# Patient Record
Sex: Female | Born: 1955 | Race: Black or African American | Hispanic: No | Marital: Married | Smoking: Current every day smoker
Health system: Southern US, Community
[De-identification: ages and names within clinical notes are randomized; demographics above are authoritative.]

## PROBLEM LIST (undated history)

## (undated) DIAGNOSIS — F410 Panic disorder [episodic paroxysmal anxiety] without agoraphobia: Secondary | ICD-10-CM

## (undated) DIAGNOSIS — E8881 Metabolic syndrome: Secondary | ICD-10-CM

## (undated) DIAGNOSIS — E119 Type 2 diabetes mellitus without complications: Secondary | ICD-10-CM

## (undated) DIAGNOSIS — E669 Obesity, unspecified: Secondary | ICD-10-CM

## (undated) DIAGNOSIS — I10 Essential (primary) hypertension: Secondary | ICD-10-CM

## (undated) DIAGNOSIS — K279 Peptic ulcer, site unspecified, unspecified as acute or chronic, without hemorrhage or perforation: Secondary | ICD-10-CM

## (undated) DIAGNOSIS — R7989 Other specified abnormal findings of blood chemistry: Secondary | ICD-10-CM

## (undated) DIAGNOSIS — J302 Other seasonal allergic rhinitis: Secondary | ICD-10-CM

## (undated) DIAGNOSIS — R945 Abnormal results of liver function studies: Secondary | ICD-10-CM

## (undated) DIAGNOSIS — E88819 Insulin resistance, unspecified: Secondary | ICD-10-CM

## (undated) DIAGNOSIS — E785 Hyperlipidemia, unspecified: Secondary | ICD-10-CM

## (undated) DIAGNOSIS — N809 Endometriosis, unspecified: Secondary | ICD-10-CM

## (undated) HISTORY — PX: ABDOMINAL HYSTERECTOMY: SHX81

## (undated) HISTORY — PX: APPENDECTOMY: SHX54

## (undated) HISTORY — DX: Type 2 diabetes mellitus without complications: E11.9

---

## 1998-02-19 ENCOUNTER — Ambulatory Visit (HOSPITAL_COMMUNITY): Admission: RE | Admit: 1998-02-19 | Discharge: 1998-02-19 | Payer: Self-pay | Admitting: Podiatry

## 1998-09-10 ENCOUNTER — Inpatient Hospital Stay (HOSPITAL_COMMUNITY): Admission: RE | Admit: 1998-09-10 | Discharge: 1998-09-12 | Payer: Self-pay | Admitting: Gynecology

## 1999-07-21 ENCOUNTER — Encounter: Payer: Self-pay | Admitting: Gynecology

## 1999-07-21 ENCOUNTER — Encounter: Admission: RE | Admit: 1999-07-21 | Discharge: 1999-07-21 | Payer: Self-pay | Admitting: Gynecology

## 1999-09-29 ENCOUNTER — Other Ambulatory Visit: Admission: RE | Admit: 1999-09-29 | Discharge: 1999-09-29 | Payer: Self-pay | Admitting: Gynecology

## 2000-10-05 ENCOUNTER — Encounter: Admission: RE | Admit: 2000-10-05 | Discharge: 2000-10-05 | Payer: Self-pay | Admitting: Gynecology

## 2000-10-05 ENCOUNTER — Encounter: Payer: Self-pay | Admitting: Gynecology

## 2000-10-12 ENCOUNTER — Other Ambulatory Visit: Admission: RE | Admit: 2000-10-12 | Discharge: 2000-10-12 | Payer: Self-pay | Admitting: Gynecology

## 2001-10-06 ENCOUNTER — Encounter: Admission: RE | Admit: 2001-10-06 | Discharge: 2001-10-06 | Payer: Self-pay | Admitting: Gynecology

## 2001-10-06 ENCOUNTER — Encounter: Payer: Self-pay | Admitting: Gynecology

## 2001-10-20 ENCOUNTER — Other Ambulatory Visit: Admission: RE | Admit: 2001-10-20 | Discharge: 2001-10-20 | Payer: Self-pay | Admitting: Gynecology

## 2002-01-01 ENCOUNTER — Other Ambulatory Visit: Admission: RE | Admit: 2002-01-01 | Discharge: 2002-01-01 | Payer: Self-pay | Admitting: Gynecology

## 2002-07-31 ENCOUNTER — Other Ambulatory Visit: Admission: RE | Admit: 2002-07-31 | Discharge: 2002-07-31 | Payer: Self-pay | Admitting: Gynecology

## 2002-10-24 ENCOUNTER — Encounter: Payer: Self-pay | Admitting: Family Medicine

## 2002-10-24 ENCOUNTER — Encounter: Admission: RE | Admit: 2002-10-24 | Discharge: 2002-10-24 | Payer: Self-pay | Admitting: *Deleted

## 2002-10-29 ENCOUNTER — Encounter: Payer: Self-pay | Admitting: Family Medicine

## 2002-10-29 ENCOUNTER — Encounter: Admission: RE | Admit: 2002-10-29 | Discharge: 2002-10-29 | Payer: Self-pay | Admitting: Family Medicine

## 2003-06-27 ENCOUNTER — Other Ambulatory Visit: Admission: RE | Admit: 2003-06-27 | Discharge: 2003-06-27 | Payer: Self-pay | Admitting: Gynecology

## 2003-07-02 ENCOUNTER — Encounter: Admission: RE | Admit: 2003-07-02 | Discharge: 2003-07-02 | Payer: Self-pay | Admitting: Gynecology

## 2003-09-23 ENCOUNTER — Encounter: Admission: RE | Admit: 2003-09-23 | Discharge: 2003-12-22 | Payer: Self-pay | Admitting: Family Medicine

## 2004-07-23 ENCOUNTER — Other Ambulatory Visit: Admission: RE | Admit: 2004-07-23 | Discharge: 2004-07-23 | Payer: Self-pay | Admitting: Gynecology

## 2004-08-26 ENCOUNTER — Encounter: Admission: RE | Admit: 2004-08-26 | Discharge: 2004-08-26 | Payer: Self-pay | Admitting: Gynecology

## 2005-08-30 ENCOUNTER — Encounter: Admission: RE | Admit: 2005-08-30 | Discharge: 2005-08-30 | Payer: Self-pay | Admitting: Gynecology

## 2005-09-06 ENCOUNTER — Other Ambulatory Visit: Admission: RE | Admit: 2005-09-06 | Discharge: 2005-09-06 | Payer: Self-pay | Admitting: Gynecology

## 2006-09-14 ENCOUNTER — Encounter: Admission: RE | Admit: 2006-09-14 | Discharge: 2006-09-14 | Payer: Self-pay | Admitting: Gynecology

## 2006-09-27 ENCOUNTER — Other Ambulatory Visit: Admission: RE | Admit: 2006-09-27 | Discharge: 2006-09-27 | Payer: Self-pay | Admitting: Gynecology

## 2007-05-02 ENCOUNTER — Ambulatory Visit: Payer: Self-pay

## 2007-09-26 ENCOUNTER — Encounter: Admission: RE | Admit: 2007-09-26 | Discharge: 2007-09-26 | Payer: Self-pay | Admitting: Gynecology

## 2008-09-26 ENCOUNTER — Encounter: Admission: RE | Admit: 2008-09-26 | Discharge: 2008-09-26 | Payer: Self-pay | Admitting: Gynecology

## 2009-09-30 ENCOUNTER — Encounter: Admission: RE | Admit: 2009-09-30 | Discharge: 2009-09-30 | Payer: Self-pay | Admitting: Gynecology

## 2010-10-07 ENCOUNTER — Other Ambulatory Visit: Payer: Self-pay | Admitting: Gynecology

## 2010-10-07 DIAGNOSIS — Z1231 Encounter for screening mammogram for malignant neoplasm of breast: Secondary | ICD-10-CM

## 2010-10-21 ENCOUNTER — Ambulatory Visit
Admission: RE | Admit: 2010-10-21 | Discharge: 2010-10-21 | Disposition: A | Discharge status: Home / Self Care | Payer: BC Managed Care – PPO | Source: Ambulatory Visit | Attending: Gynecology | Admitting: Gynecology

## 2010-10-21 DIAGNOSIS — Z1231 Encounter for screening mammogram for malignant neoplasm of breast: Secondary | ICD-10-CM

## 2011-11-04 ENCOUNTER — Other Ambulatory Visit: Payer: Self-pay | Admitting: Gynecology

## 2011-11-04 DIAGNOSIS — Z1231 Encounter for screening mammogram for malignant neoplasm of breast: Secondary | ICD-10-CM

## 2011-11-18 ENCOUNTER — Ambulatory Visit
Admission: RE | Admit: 2011-11-18 | Discharge: 2011-11-18 | Disposition: A | Discharge status: Home / Self Care | Payer: BC Managed Care – PPO | Source: Ambulatory Visit | Attending: Gynecology | Admitting: Gynecology

## 2011-11-18 DIAGNOSIS — Z1231 Encounter for screening mammogram for malignant neoplasm of breast: Secondary | ICD-10-CM

## 2012-09-13 ENCOUNTER — Emergency Department (HOSPITAL_COMMUNITY): Payer: BC Managed Care – PPO

## 2012-09-13 ENCOUNTER — Encounter (HOSPITAL_COMMUNITY): Payer: Self-pay

## 2012-09-13 ENCOUNTER — Inpatient Hospital Stay (HOSPITAL_COMMUNITY)
Admission: EM | Admit: 2012-09-13 | Discharge: 2012-09-15 | DRG: 174 | Disposition: A | Discharge status: Home / Self Care | Payer: BC Managed Care – PPO | Attending: Internal Medicine | Admitting: Internal Medicine

## 2012-09-13 DIAGNOSIS — K922 Gastrointestinal hemorrhage, unspecified: Secondary | ICD-10-CM

## 2012-09-13 DIAGNOSIS — R945 Abnormal results of liver function studies: Secondary | ICD-10-CM | POA: Diagnosis present

## 2012-09-13 DIAGNOSIS — R7402 Elevation of levels of lactic acid dehydrogenase (LDH): Secondary | ICD-10-CM | POA: Diagnosis present

## 2012-09-13 DIAGNOSIS — J309 Allergic rhinitis, unspecified: Secondary | ICD-10-CM | POA: Diagnosis present

## 2012-09-13 DIAGNOSIS — E785 Hyperlipidemia, unspecified: Secondary | ICD-10-CM | POA: Diagnosis present

## 2012-09-13 DIAGNOSIS — Z79899 Other long term (current) drug therapy: Secondary | ICD-10-CM

## 2012-09-13 DIAGNOSIS — K921 Melena: Secondary | ICD-10-CM | POA: Diagnosis present

## 2012-09-13 DIAGNOSIS — E8881 Metabolic syndrome: Secondary | ICD-10-CM | POA: Diagnosis present

## 2012-09-13 DIAGNOSIS — Z6828 Body mass index (BMI) 28.0-28.9, adult: Secondary | ICD-10-CM

## 2012-09-13 DIAGNOSIS — K3182 Dieulafoy lesion (hemorrhagic) of stomach and duodenum: Principal | ICD-10-CM | POA: Diagnosis present

## 2012-09-13 DIAGNOSIS — F17200 Nicotine dependence, unspecified, uncomplicated: Secondary | ICD-10-CM | POA: Diagnosis present

## 2012-09-13 DIAGNOSIS — Z886 Allergy status to analgesic agent status: Secondary | ICD-10-CM

## 2012-09-13 DIAGNOSIS — Z8711 Personal history of peptic ulcer disease: Secondary | ICD-10-CM

## 2012-09-13 DIAGNOSIS — R7401 Elevation of levels of liver transaminase levels: Secondary | ICD-10-CM

## 2012-09-13 DIAGNOSIS — Z90710 Acquired absence of both cervix and uterus: Secondary | ICD-10-CM

## 2012-09-13 DIAGNOSIS — Z888 Allergy status to other drugs, medicaments and biological substances status: Secondary | ICD-10-CM

## 2012-09-13 DIAGNOSIS — Z9089 Acquired absence of other organs: Secondary | ICD-10-CM

## 2012-09-13 DIAGNOSIS — K2970 Gastritis, unspecified, without bleeding: Secondary | ICD-10-CM | POA: Diagnosis present

## 2012-09-13 DIAGNOSIS — F410 Panic disorder [episodic paroxysmal anxiety] without agoraphobia: Secondary | ICD-10-CM | POA: Diagnosis present

## 2012-09-13 DIAGNOSIS — D62 Acute posthemorrhagic anemia: Secondary | ICD-10-CM

## 2012-09-13 DIAGNOSIS — E669 Obesity, unspecified: Secondary | ICD-10-CM | POA: Diagnosis present

## 2012-09-13 DIAGNOSIS — I10 Essential (primary) hypertension: Secondary | ICD-10-CM | POA: Diagnosis present

## 2012-09-13 HISTORY — DX: Obesity, unspecified: E66.9

## 2012-09-13 HISTORY — DX: Endometriosis, unspecified: N80.9

## 2012-09-13 HISTORY — DX: Other seasonal allergic rhinitis: J30.2

## 2012-09-13 HISTORY — DX: Hyperlipidemia, unspecified: E78.5

## 2012-09-13 HISTORY — DX: Insulin resistance, unspecified: E88.819

## 2012-09-13 HISTORY — DX: Panic disorder (episodic paroxysmal anxiety): F41.0

## 2012-09-13 HISTORY — DX: Peptic ulcer, site unspecified, unspecified as acute or chronic, without hemorrhage or perforation: K27.9

## 2012-09-13 HISTORY — DX: Other specified abnormal findings of blood chemistry: R79.89

## 2012-09-13 HISTORY — DX: Essential (primary) hypertension: I10

## 2012-09-13 HISTORY — DX: Metabolic syndrome: E88.81

## 2012-09-13 HISTORY — DX: Abnormal results of liver function studies: R94.5

## 2012-09-13 LAB — CBC
Hemoglobin: 9.3 g/dL — ABNORMAL LOW (ref 12.0–15.0)
MCHC: 32.9 g/dL (ref 30.0–36.0)
RDW: 14.7 % (ref 11.5–15.5)
WBC: 14.5 10*3/uL — ABNORMAL HIGH (ref 4.0–10.5)

## 2012-09-13 LAB — COMPREHENSIVE METABOLIC PANEL
ALT: 49 U/L — ABNORMAL HIGH (ref 0–35)
Albumin: 3.7 g/dL (ref 3.5–5.2)
Alkaline Phosphatase: 256 U/L — ABNORMAL HIGH (ref 39–117)
Glucose, Bld: 102 mg/dL — ABNORMAL HIGH (ref 70–99)
Potassium: 4 mEq/L (ref 3.5–5.1)
Sodium: 137 mEq/L (ref 135–145)
Total Protein: 8.4 g/dL — ABNORMAL HIGH (ref 6.0–8.3)

## 2012-09-13 LAB — ABO/RH: ABO/RH(D): B POS

## 2012-09-13 MED ORDER — ONDANSETRON HCL 4 MG/2ML IJ SOLN
4.0000 mg | Freq: Once | INTRAMUSCULAR | Status: AC
  Administered 2012-09-13: 4 mg via INTRAVENOUS
  Filled 2012-09-13: qty 2

## 2012-09-13 MED ORDER — SODIUM CHLORIDE 0.9 % IV SOLN
INTRAVENOUS | Status: DC
  Administered 2012-09-13: 18:00:00 via INTRAVENOUS

## 2012-09-13 MED ORDER — SODIUM CHLORIDE 0.9 % IV SOLN
INTRAVENOUS | Status: DC
  Administered 2012-09-13: via INTRAVENOUS

## 2012-09-13 MED ORDER — IOHEXOL 300 MG/ML  SOLN
50.0000 mL | Freq: Once | INTRAMUSCULAR | Status: AC | PRN
  Administered 2012-09-13: 50 mL via ORAL

## 2012-09-13 MED ORDER — IOHEXOL 300 MG/ML  SOLN
100.0000 mL | Freq: Once | INTRAMUSCULAR | Status: AC | PRN
  Administered 2012-09-13: 100 mL via INTRAVENOUS

## 2012-09-13 NOTE — Progress Notes (Signed)
Pt has arrived from the ED via stretcher. Pt is in no apparent distress. Pt's vital signs are stable. Pt was oriented to room and safety video has been viewed. RN paged admissions MD and is awaiting further orders. RN will continue to monitor pt.-Judy Pimple, RN

## 2012-09-13 NOTE — ED Notes (Signed)
Pt ambulated to bathroom with no assistance.  

## 2012-09-13 NOTE — ED Notes (Signed)
Patient transported to CT 

## 2012-09-13 NOTE — ED Notes (Signed)
Admitting MD at bedside to speak with pt/family.

## 2012-09-13 NOTE — H&P (Signed)
History and Physical  Rachel Banks:[id number] DOB: 30-Mar-1956 DOA: 09/13/2012  Referring physician: Dr. Deretha Emory PCP: Default, Provider, MD Deboraha Sprang at [facility]   Chief Complaint: tarry stools  HPI:  57 year old woman presented emergency department with history of tarry stools, hemoglobin noted to be 9.3 and referred for admission for GI bleed.  She was doing fine until suddenly yesterday morning she noticed she had a low blood pressure. She had presyncopal symptoms with ambulation and reported to dark stools yesterday. She 1 dark stool day of admission. She continue to have presyncopal symptoms, dyspnea on exertion and felt like she would pass out. She denies history of any GI bleed. Denies NSAIDs. Has had a colonoscopy sometime in the recent past although she cannot remember who performed at, she reports was unremarkable. She thinks she is due for colonoscopy this year.  Seen by primary care physician and was sent to emergency department for hypotension, dizziness and tarry stools. Rectal exam heme positive per PCP.  In the emergency department--afebrile, stable vital signs. Noted to ambulate without assistance. Hemoglobin 9.3, white blood cell count 14.5. Complete metabolic panel notable for calcium 11.5, alkaline phosphatase 256, AST 45, ALT 49. CT of the abdomen and pelvis reveal no definite abnormality. Question mucosal thickening posterior rectum. abnormal lung bases with bronchial thickening and scattered pulmonary densities. Possibly chronic bronchitis or other chronic lung disease.  Review of Systems:  Negative for fever, visual changes, sore throat, rash, new muscle aches, chest pain, dysuria, n/v/abdominal pain/diarrhea  Positive for chronic right shoulder pain, DOE, poor appetite, flu 07/2012  Past Medical History  Diagnosis Date  . Panic attacks   . Hypertension   . Hyperlipidemia   . Obesity   . Seasonal allergies   . Insulin resistance   . Elevated LFTs      Past Surgical History  Procedure Date  . Abdominal hysterectomy     Social History:  reports that she has been smoking Cigarettes.  She has been smoking about .5 packs per day. She does not have any smokeless tobacco history on file. She reports that she does not drink alcohol or use illicit drugs.  Allergies  Allergen Reactions  . Patanol (Olopatadine Hcl) Swelling    No family history on file. No particular medical problems  Prior to Admission medications   Medication Sig Start Date End Date Taking? Authorizing Provider  amLODipine (NORVASC) 5 MG tablet Take 5 mg by mouth daily.   Yes Historical Provider, MD  cetirizine (ZYRTEC) 10 MG tablet Take 10 mg by mouth daily.   Yes Historical Provider, MD  hydrochlorothiazide (HYDRODIURIL) 25 MG tablet Take 25 mg by mouth daily.   Yes Historical Provider, MD  Multiple Vitamins-Minerals (MULTIVITAMIN PO) Take 1 tablet by mouth daily.   Yes Historical Provider, MD  Crestor 10  Physical Exam: Filed Vitals:   09/13/12 1800 09/13/12 1830 09/13/12 1915 09/13/12 2015  BP: 138/80 133/73 120/66 145/81  Pulse: 97 107 97   Temp:      TempSrc:      Resp: 18 14 18 14   SpO2: 100% 100% 100% 100%   General:  Examined in emergency department. Appears calm and comfortable Eyes: PERRL, normal lids, irises ENT: grossly normal hearing, lips & tongue Neck: no LAD, masses or thyromegaly Cardiovascular: RRR, no m/r/g. No LE edema.  Respiratory: CTA bilaterally, no w/r/r. Normal respiratory effort. Abdomen: soft, ntnd Skin: no rash or induration seen Musculoskeletal: grossly normal tone BUE/BLE Psychiatric: grossly normal mood and affect, speech fluent  and appropriate Neurologic: grossly non-focal.  Wt Readings from Last 3 Encounters:  No data found for Wt    Labs on Admission:  Basic Metabolic Panel:  Lab 09/13/12 1610  NA 137  K 4.0  CL 96  CO2 28  GLUCOSE 102*  BUN 24*  CREATININE 0.84  CALCIUM 11.5*  MG --  PHOS --     Liver Function Tests:  Lab 09/13/12 1553  AST 45*  ALT 49*  ALKPHOS 256*  BILITOT 0.2*  PROT 8.4*  ALBUMIN 3.7    CBC:  Lab 09/13/12 1553  WBC 14.5*  NEUTROABS --  HGB 9.3*  HCT 28.3*  MCV 81.1  PLT 486*   Radiological Exams on Admission: Ct Abdomen Pelvis W Contrast  09/13/2012  *RADIOLOGY REPORT*  Clinical Data: Bloody stools.  Weight loss.  Dizziness.  CT ABDOMEN AND PELVIS WITH CONTRAST  Technique:  Multidetector CT imaging of the abdomen and pelvis was performed following the standard protocol during bolus administration of intravenous contrast.  Contrast: OMNIPAQUE IOHEXOL 300 MG/ML  SOLN  Comparison: None.  Findings: Lung bases are abnormal with bronchial thickening and pulmonary scarring or atelectasis.  This does not represent a thorough evaluation.  No pleural or pericardial fluid.  The liver has a normal appearance without focal lesions or biliary ductal dilatation.  No calcified gallstones.  The spleen is normal. The pancreas is normal.  The adrenal glands are normal with the exception that there could possibly be an adenoma within the right adrenal, though this is not clearly demonstrated because it is immediately adjacent to the liver.  I do not suspect a significant lesion.  Renal parenchyma is normal bilaterally.  No cyst, mass, stone or hydronephrosis.  The aorta and its branches shows some atherosclerotic disease but there is no aneurysm.  The IVC is normal.  No retroperitoneal mass or adenopathy.  No free fluid or air.  The bladder is normal.  Uterus and adnexal regions are normal.  No acute bowel pathology is definitely evident.  One could question some thickening along the posterior rectum and a mucosal lesion in that area is not excluded.  IMPRESSION: No definite abnormality by CT.  Question of mucosal thickening at the posterior rectum, but this is not a reliable finding using this technique.  Abnormal lung bases with bronchial thickening and some scattered  pulmonary densities.  There could be chronic bronchitis or other chronic lung disease.  This is not fully or completely evaluated.   Original Report Authenticated By: Paulina Fusi, M.D.     Active Problems:  GI bleed  Acute blood loss anemia  Elevated transaminase level   Assessment/Plan 1. GI bleed: She does report a vague history of peptic ulcer disease although it is not clear she was never definitively diagnosed with this, this was when she was a young woman. Could be lower. Suspect she will need upper and lower endoscopy. No bowel movement since approximately 10 hours prior to presentation. IV Protonix. Serial CBC. Hemodynamically stable. GI consultation in the morning 2. Acute blood loss anemia, symptomatic: Serial CBC. Transfuse if further bleeding her hemoglobin dropped significantly. 3. Elevated transaminases: Mild. May be related to Crestor. Recheck in the morning. 4. Abnormal CT of the abdomen and pelvis: Possible abnormality of the posterior rectum. Further evaluation per GI. 5. Question chronic bronchitis: Patient had influenza 12/13 however she denies any chronic lung problems. Consider further evaluation with CT. She is asymptomatic.  Code Status: Full code Family Communication: Discussed with husband  at bedside Disposition Plan/Anticipated LOS: Admit, 2-3 days  Time spent: 60 minutes  Brendia Sacks, MD  [facility] Pager [phone] 09/13/2012, 8:57 PM

## 2012-09-13 NOTE — ED Notes (Signed)
Pt went to MD and sent here because of low BP, dizziness, and tarry stools which started yesterday. Pt c/o pain across the back of her shoulders. States her BP goes up and comes down. Took her BP medication this morning.

## 2012-09-13 NOTE — ED Provider Notes (Signed)
History     CSN: [id number]  Arrival date & time 09/13/12  1511   First MD Initiated Contact with Patient 09/13/12 1719      Chief Complaint  Patient presents with  . Dizziness  . Melena    (Consider location/radiation/quality/duration/timing/severity/associated sxs/prior treatment) The history is provided by the patient and a relative.  57 year old female, sent from [facility] for tarry heme positive stools, low BP 90/68, and lightheadedness. Tarry stools, no red blood, started yesterday. Has been feeling like she is going to pass out. No abdominal pain. No chest pain. No NV.   Past Medical History  Diagnosis Date  . Panic attacks   . Hypertension   . Hyperlipidemia   . Obesity   . Seasonal allergies   . Insulin resistance   . Elevated LFTs     Past Surgical History  Procedure Date  . Abdominal hysterectomy     No family history on file.  History  Substance Use Topics  . Smoking status: Current Every Day Smoker -- 0.5 packs/day    Types: Cigarettes  . Smokeless tobacco: Not on file  . Alcohol Use: No    OB History    Grav Para Term Preterm Abortions TAB SAB Ect Mult Living                  Review of Systems  Constitutional: Positive for fatigue. Negative for fever.  HENT: Negative for congestion.   Eyes: Negative for visual disturbance.  Respiratory: Negative for chest tightness and shortness of breath.   Cardiovascular: Negative for chest pain.  Gastrointestinal: Positive for blood in stool. Negative for nausea, vomiting, abdominal pain, diarrhea and rectal pain.  Genitourinary: Negative for dysuria and hematuria.  Musculoskeletal: Negative for back pain.  Skin: Negative for rash.  Neurological: Positive for light-headedness.  Hematological: Does not bruise/bleed easily.    Allergies  Patanol  Home Medications   Current Outpatient Rx  Name  Route  Sig  Dispense  Refill  . AMLODIPINE BESYLATE 5 MG PO TABS   Oral   Take 5 mg by mouth  daily.         Marland Kitchen CETIRIZINE HCL 10 MG PO TABS   Oral   Take 10 mg by mouth daily.         Marland Kitchen HYDROCHLOROTHIAZIDE 25 MG PO TABS   Oral   Take 25 mg by mouth daily.         . MULTIVITAMIN PO   Oral   Take 1 tablet by mouth daily.           BP 145/81  Pulse 97  Temp 99.1 F (37.3 C) (Oral)  Resp 14  SpO2 100%  Physical Exam  Nursing note and vitals reviewed. Constitutional: She is oriented to person, place, and time. She appears well-developed and well-nourished. No distress.  HENT:  Head: Normocephalic and atraumatic.  Mouth/Throat: Oropharynx is clear and moist.  Eyes: Conjunctivae normal and EOM are normal. Pupils are equal, round, and reactive to light.  Neck: Normal range of motion.  Cardiovascular: Normal rate, regular rhythm and normal heart sounds.   No murmur heard.      Tachycardic  Pulmonary/Chest: Effort normal and breath sounds normal. No respiratory distress.  Abdominal: Soft. Bowel sounds are normal. There is no tenderness.  Genitourinary:       Anorectal exam not repeated just done in PCM's office, tarry heme positive stool.   Musculoskeletal: Normal range of motion.  Lymphadenopathy:  She has no cervical adenopathy.  Neurological: She is alert and oriented to person, place, and time. No cranial nerve deficit. She exhibits normal muscle tone. Coordination normal.  Skin: Skin is warm. No rash noted.    ED Course  Procedures (including critical care time)  Labs Reviewed  CBC - Abnormal; Notable for the following:    WBC 14.5 (*)     RBC 3.49 (*)     Hemoglobin 9.3 (*)     HCT 28.3 (*)     Platelets 486 (*)     All other components within normal limits  COMPREHENSIVE METABOLIC PANEL - Abnormal; Notable for the following:    Glucose, Bld 102 (*)     BUN 24 (*)     Calcium 11.5 (*)     Total Protein 8.4 (*)     AST 45 (*)     ALT 49 (*)     Alkaline Phosphatase 256 (*)     Total Bilirubin 0.2 (*)     GFR calc non Af Amer 76 (*)      GFR calc Af Amer 88 (*)     All other components within normal limits  TYPE AND SCREEN  ABO/RH   Ct Abdomen Pelvis W Contrast  09/13/2012  *RADIOLOGY REPORT*  Clinical Data: Bloody stools.  Weight loss.  Dizziness.  CT ABDOMEN AND PELVIS WITH CONTRAST  Technique:  Multidetector CT imaging of the abdomen and pelvis was performed following the standard protocol during bolus administration of intravenous contrast.  Contrast: OMNIPAQUE IOHEXOL 300 MG/ML  SOLN  Comparison: None.  Findings: Lung bases are abnormal with bronchial thickening and pulmonary scarring or atelectasis.  This does not represent a thorough evaluation.  No pleural or pericardial fluid.  The liver has a normal appearance without focal lesions or biliary ductal dilatation.  No calcified gallstones.  The spleen is normal. The pancreas is normal.  The adrenal glands are normal with the exception that there could possibly be an adenoma within the right adrenal, though this is not clearly demonstrated because it is immediately adjacent to the liver.  I do not suspect a significant lesion.  Renal parenchyma is normal bilaterally.  No cyst, mass, stone or hydronephrosis.  The aorta and its branches shows some atherosclerotic disease but there is no aneurysm.  The IVC is normal.  No retroperitoneal mass or adenopathy.  No free fluid or air.  The bladder is normal.  Uterus and adnexal regions are normal.  No acute bowel pathology is definitely evident.  One could question some thickening along the posterior rectum and a mucosal lesion in that area is not excluded.  IMPRESSION: No definite abnormality by CT.  Question of mucosal thickening at the posterior rectum, but this is not a reliable finding using this technique.  Abnormal lung bases with bronchial thickening and some scattered pulmonary densities.  There could be chronic bronchitis or other chronic lung disease.  This is not fully or completely evaluated.   Original Report Authenticated By:  Paulina Fusi, M.D.    Results for orders placed during the hospital encounter of 09/13/12  CBC      Component Value Range   WBC 14.5 (*) 4.0 - 10.5 K/uL   RBC 3.49 (*) 3.87 - 5.11 MIL/uL   Hemoglobin 9.3 (*) 12.0 - 15.0 g/dL   HCT 40.9 (*) 81.1 - 91.4 %   MCV 81.1  78.0 - 100.0 fL   MCH 26.6  26.0 - 34.0 pg  MCHC 32.9  30.0 - 36.0 g/dL   RDW 16.1  09.6 - 04.5 %   Platelets 486 (*) 150 - 400 K/uL  COMPREHENSIVE METABOLIC PANEL      Component Value Range   Sodium 137  135 - 145 mEq/L   Potassium 4.0  3.5 - 5.1 mEq/L   Chloride 96  96 - 112 mEq/L   CO2 28  19 - 32 mEq/L   Glucose, Bld 102 (*) 70 - 99 mg/dL   BUN 24 (*) 6 - 23 mg/dL   Creatinine, Ser 4.09  0.50 - 1.10 mg/dL   Calcium 81.1 (*) 8.4 - 10.5 mg/dL   Total Protein 8.4 (*) 6.0 - 8.3 g/dL   Albumin 3.7  3.5 - 5.2 g/dL   AST 45 (*) 0 - 37 U/L   ALT 49 (*) 0 - 35 U/L   Alkaline Phosphatase 256 (*) 39 - 117 U/L   Total Bilirubin 0.2 (*) 0.3 - 1.2 mg/dL   GFR calc non Af Amer 76 (*) >90 mL/min   GFR calc Af Amer 88 (*) >90 mL/min  TYPE AND SCREEN      Component Value Range   ABO/RH(D) B POS     Antibody Screen NEG     Sample Expiration 09/16/2012    ABO/RH      Component Value Range   ABO/RH(D) B POS       1. GI bleed       MDM   Patient with new onset of tarry stools. Began yesterday. Today he got very lightheaded culture to pass out when up around walking. Put her primary care doctor who did a rectal exam confirmed heme positive stools no gross blood. Workup here shows a low hemoglobin and hematocrit did have a baseline for comparison. Symptoms associated with abdominal pain CT of the abdomen without any cystic findings. Patient's blood pressure was normal while laying initially her heart rate was around 112 with fluids it improved. Patient asymptomatic in the ED however she has been in bed.  She is followed by [facility] family practice at [place]. She will be admitted by hospitalist team 10 temporary admit  orders have been done. [facility] regular MedSurg floor. A regular admission.         Shelda Jakes, MD 09/16/12 [phone]

## 2012-09-14 ENCOUNTER — Encounter (HOSPITAL_COMMUNITY)
Admission: EM | Disposition: A | Discharge status: Home / Self Care | Payer: Self-pay | Source: Home / Self Care | Attending: Internal Medicine

## 2012-09-14 ENCOUNTER — Encounter (HOSPITAL_COMMUNITY): Payer: Self-pay

## 2012-09-14 DIAGNOSIS — K922 Gastrointestinal hemorrhage, unspecified: Secondary | ICD-10-CM

## 2012-09-14 DIAGNOSIS — K921 Melena: Secondary | ICD-10-CM | POA: Diagnosis present

## 2012-09-14 DIAGNOSIS — D62 Acute posthemorrhagic anemia: Secondary | ICD-10-CM

## 2012-09-14 DIAGNOSIS — R7401 Elevation of levels of liver transaminase levels: Secondary | ICD-10-CM

## 2012-09-14 HISTORY — PX: ESOPHAGOGASTRODUODENOSCOPY: SHX5428

## 2012-09-14 LAB — CBC
HCT: 23.9 % — ABNORMAL LOW (ref 36.0–46.0)
Hemoglobin: 11 g/dL — ABNORMAL LOW (ref 12.0–15.0)
MCH: 27.7 pg (ref 26.0–34.0)
MCH: 27.6 pg (ref 26.0–34.0)
MCHC: 33 g/dL (ref 30.0–36.0)
MCV: 81 fL (ref 78.0–100.0)
MCV: 83.5 fL (ref 78.0–100.0)
Platelets: 330 10*3/uL (ref 150–400)
RBC: 2.95 MIL/uL — ABNORMAL LOW (ref 3.87–5.11)
RBC: 3.72 MIL/uL — ABNORMAL LOW (ref 3.87–5.11)
RBC: 3.99 MIL/uL (ref 3.87–5.11)
WBC: 10.8 10*3/uL — ABNORMAL HIGH (ref 4.0–10.5)
WBC: 9.9 10*3/uL (ref 4.0–10.5)

## 2012-09-14 LAB — PREPARE RBC (CROSSMATCH)

## 2012-09-14 LAB — COMPREHENSIVE METABOLIC PANEL
BUN: 18 mg/dL (ref 6–23)
CO2: 30 mEq/L (ref 19–32)
Chloride: 101 mEq/L (ref 96–112)
Creatinine, Ser: 0.85 mg/dL (ref 0.50–1.10)
GFR calc non Af Amer: 75 mL/min — ABNORMAL LOW (ref >90)
Total Bilirubin: 0.2 mg/dL — ABNORMAL LOW (ref 0.3–1.2)

## 2012-09-14 LAB — PROTIME-INR
INR: 1.09 (ref 0.00–1.49)
Prothrombin Time: 14 seconds (ref 11.6–15.2)

## 2012-09-14 SURGERY — EGD (ESOPHAGOGASTRODUODENOSCOPY)
Anesthesia: Moderate Sedation

## 2012-09-14 MED ORDER — FUROSEMIDE 10 MG/ML IJ SOLN: 20.0000 mg | Freq: Once | INTRAMUSCULAR | Status: DC

## 2012-09-14 MED ORDER — PANTOPRAZOLE SODIUM 40 MG IV SOLR
40.0000 mg | Freq: Two times a day (BID) | INTRAVENOUS | Status: DC
  Administered 2012-09-14 – 2012-09-15 (×3): 40 mg via INTRAVENOUS
  Filled 2012-09-14 (×6): qty 40

## 2012-09-14 MED ORDER — EPINEPHRINE HCL 0.1 MG/ML IJ SOLN
INTRAMUSCULAR | Status: AC
  Filled 2012-09-14: qty 10

## 2012-09-14 MED ORDER — ACETAMINOPHEN 650 MG RE SUPP: 650.0000 mg | Freq: Four times a day (QID) | RECTAL | Status: DC | PRN

## 2012-09-14 MED ORDER — MIDAZOLAM HCL 10 MG/2ML IJ SOLN
INTRAMUSCULAR | Status: DC | PRN
  Administered 2012-09-14: 1 mg via INTRAVENOUS
  Administered 2012-09-14 (×2): 2 mg via INTRAVENOUS

## 2012-09-14 MED ORDER — ACETAMINOPHEN 325 MG PO TABS: 650.0000 mg | ORAL_TABLET | Freq: Four times a day (QID) | ORAL | Status: DC | PRN

## 2012-09-14 MED ORDER — ONDANSETRON HCL 4 MG/2ML IJ SOLN: 4.0000 mg | Freq: Four times a day (QID) | INTRAMUSCULAR | Status: DC | PRN

## 2012-09-14 MED ORDER — ACETAMINOPHEN 325 MG PO TABS
650.0000 mg | ORAL_TABLET | Freq: Once | ORAL | Status: AC
  Administered 2012-09-14: 650 mg via ORAL
  Filled 2012-09-14: qty 2

## 2012-09-14 MED ORDER — SODIUM CHLORIDE 0.9 % IV SOLN
80.0000 mg | Freq: Once | INTRAVENOUS | Status: AC
  Administered 2012-09-14: 80 mg via INTRAVENOUS
  Filled 2012-09-14: qty 80

## 2012-09-14 MED ORDER — FENTANYL CITRATE 0.05 MG/ML IJ SOLN
INTRAMUSCULAR | Status: AC
  Filled 2012-09-14: qty 4

## 2012-09-14 MED ORDER — SODIUM CHLORIDE 0.9 % IV SOLN: INTRAVENOUS | Status: DC

## 2012-09-14 MED ORDER — FENTANYL CITRATE 0.05 MG/ML IJ SOLN
INTRAMUSCULAR | Status: DC | PRN
  Administered 2012-09-14 (×2): 25 ug via INTRAVENOUS

## 2012-09-14 MED ORDER — SODIUM CHLORIDE 0.9 % IJ SOLN
INTRAMUSCULAR | Status: DC | PRN
  Administered 2012-09-14: 09:00:00

## 2012-09-14 MED ORDER — DIPHENHYDRAMINE HCL 50 MG/ML IJ SOLN
25.0000 mg | Freq: Once | INTRAMUSCULAR | Status: AC
  Administered 2012-09-14: 25 mg via INTRAVENOUS
  Filled 2012-09-14 (×2): qty 1

## 2012-09-14 MED ORDER — ONDANSETRON HCL 4 MG PO TABS: 4.0000 mg | ORAL_TABLET | Freq: Four times a day (QID) | ORAL | Status: DC | PRN

## 2012-09-14 MED ORDER — BUTAMBEN-TETRACAINE-BENZOCAINE 2-2-14 % EX AERO
INHALATION_SPRAY | CUTANEOUS | Status: DC | PRN
  Administered 2012-09-14: 2 via TOPICAL

## 2012-09-14 MED ORDER — MIDAZOLAM HCL 5 MG/ML IJ SOLN
INTRAMUSCULAR | Status: AC
  Filled 2012-09-14: qty 3

## 2012-09-14 MED ORDER — BOOST / RESOURCE BREEZE PO LIQD
1.0000 | Freq: Three times a day (TID) | ORAL | Status: DC
  Administered 2012-09-14 – 2012-09-15 (×3): 1 via ORAL

## 2012-09-14 MED ORDER — SODIUM CHLORIDE 0.9 % IV SOLN
8.0000 mg/h | INTRAVENOUS | Status: DC
  Administered 2012-09-14: 8 mg/h via INTRAVENOUS
  Filled 2012-09-14 (×3): qty 80

## 2012-09-14 NOTE — Progress Notes (Signed)
INITIAL NUTRITION ASSESSMENT  DOCUMENTATION CODES Per approved criteria  -Not Applicable   INTERVENTION: Resource Breeze TID. Each supplement provides 250 kcal and 9 grams of protein.   NUTRITION DIAGNOSIS: Inadequate oral intake related to pain & dizziness 2/2 GI bleed as evidenced by decreased appetite and poor intake per pt report.   Goal: Pt meet >/= 90% of estimated needs.   Monitor:  Po intake, weight   Reason for Assessment: Malnutrition Screening Tool (MST=4)  57 y.o. female  Admitting Dx: GI bleed  ASSESSMENT: According to malnutrition screening tool, pt lost ~10 lbs over a month d/t the flu. Pt admitted to hospital c/o low blood pressure, presyncopal symptoms, dyspnea on exertion, and dark tarry stools.  MD noted abnormal lung bases with bronchial thickening and scattered pulmonary densities. Possibly chronic bronchitis or other chronic lung disease. Pt diagnosed with GI bleed and is currently on clear liquid diet.     RD spoke with pt. Pt confirms weight loss and loss of appetite. Pt feels like she hasn't really covered since losing weight d/t the flu. Pt open to oral nutrition supplement to meet protein needs while on clear liquid diet.   Height: Ht Readings from Last 1 Encounters:  09/13/12 5\' 4"  (1.626 m)    Weight: Wt Readings from Last 1 Encounters:  09/13/12 166 lb 14.2 oz (75.7 kg)    Ideal Body Weight: 120 lbs   % Ideal Body Weight: 138%  Wt Readings from Last 10 Encounters:  09/13/12 166 lb 14.2 oz (75.7 kg)  09/13/12 166 lb 14.2 oz (75.7 kg)    Usual Body Weight: 179 lbs   % Usual Body Weight: 92%  BMI:  Body mass index is 28.65 kg/(m^2). - overweight   Estimated Nutritional Needs: Kcal: 1600 - 1800  Protein: 60 - 75g Fluid: 1.8 - 2 L   Skin: intact   Diet Order: Clear Liquid  EDUCATION NEEDS: -No education needs identified at this time   Intake/Output Summary (Last 24 hours) at 09/14/12 1419 Last data filed at 09/14/12 0559  Gross per 24 hour  Intake    610 ml  Output      0 ml  Net    610 ml    Last BM: Patient had one melenotic stool 09/14/2012   Labs:   Lab 09/14/12 0310 09/13/12 1553  NA 138 137  K 4.1 4.0  CL 101 96  CO2 30 28  BUN 18 24*  CREATININE 0.85 0.84  CALCIUM 9.8 11.5*  MG -- --  PHOS -- --  GLUCOSE 122* 102*    CBG (last 3)  No results found for this basename: GLUCAP:3 in the last 72 hours  Scheduled Meds:   . furosemide  20 mg Intravenous Once  . pantoprazole (PROTONIX) IV  40 mg Intravenous Q12H    Continuous Infusions:   Past Medical History  Diagnosis Date  . Panic attacks   . Hypertension   . Hyperlipidemia   . Obesity   . Seasonal allergies   . Insulin resistance   . Elevated LFTs   . Endometriosis   . PUD (peptic ulcer disease)     Past Surgical History  Procedure Date  . Abdominal hysterectomy   . Appendectomy     Belenda Cruise  Dietetic Intern Pager: [phone]

## 2012-09-14 NOTE — Consult Note (Signed)
Referring Provider: Dr. Irene Limbo Primary Care Physician:  Default, Provider, MD Primary Gastroenterologist:  Gentry Fitz  Reason for Consultation:  Melena  HPI: Rachel Banks is a 57 y.o. female who started having black tarry stools this past Tuesday (3 days ago) and has been having several formed black stools daily without any abdominal pain, N,V, hematochezia, or hematemesis. Denies previous history of black stools or GI bleeding. Remote history of ulcer noted on EGD over 10 years ago but denies that it was bleeding at the time. Denies NSAIDs, alcohol, iron pills, or Pepto Bismol. Felt lightheaded and had dyspnea on exertion yesterday and when she went to her PCP she was sent to the ER. Denies chest pain or hematuria. Hgb 9.3 in ER that dropped to 7.7 today. Reports normal colonoscopy 5 years ago in [place] (she is unsure of doctor that did it).  Past Medical History  Diagnosis Date  . Panic attacks   . Hypertension   . Hyperlipidemia   . Obesity   . Seasonal allergies   . Insulin resistance   . Elevated LFTs   . Endometriosis   . PUD (peptic ulcer disease)     Past Surgical History  Procedure Date  . Abdominal hysterectomy   . Appendectomy     Prior to Admission medications   Medication Sig Start Date End Date Taking? Authorizing Provider  amLODipine (NORVASC) 5 MG tablet Take 5 mg by mouth daily.   Yes Historical Provider, MD  cetirizine (ZYRTEC) 10 MG tablet Take 10 mg by mouth daily.   Yes Historical Provider, MD  hydrochlorothiazide (HYDRODIURIL) 25 MG tablet Take 25 mg by mouth daily.   Yes Historical Provider, MD  Multiple Vitamins-Minerals (MULTIVITAMIN PO) Take 1 tablet by mouth daily.   Yes Historical Provider, MD    Scheduled Meds:   . [facility] HOLD] furosemide  20 mg Intravenous Once   Continuous Infusions:   . sodium chloride    . pantoprozole (PROTONIX) infusion 8 mg/hr (09/14/12 0408)   PRN Meds:.[MAR HOLD] acetaminophen, [MAR HOLD] acetaminophen, [MAR  HOLD] ondansetron (ZOFRAN) IV, [MAR HOLD] ondansetron  Allergies as of 09/13/2012 - Review Complete 09/13/2012  Allergen Reaction Noted  . Aspirin  09/13/2012  . Patanol (olopatadine hcl) Swelling 09/13/2012    History reviewed. No pertinent family history.  History   Social History  . Marital Status: Married    Spouse Name: N/A    Number of Children: N/A  . Years of Education: N/A   Occupational History  . Not on file.   Social History Main Topics  . Smoking status: Current Every Day Smoker -- 0.5 packs/day    Types: Cigarettes  . Smokeless tobacco: Not on file  . Alcohol Use: No  . Drug Use: No  . Sexually Active:    Other Topics Concern  . Not on file   Social History Narrative  . No narrative on file    Review of Systems: All negative except as stated above in HPI.  Physical Exam: Vital signs: Filed Vitals:   09/14/12 0839  BP: 129/86  Pulse: 86  Temp: 98.8 F (37.1 C)  Resp: 19   Last BM Date: 09/13/12 General:   Alert,  Well-developed, well-nourished, pleasant and cooperative in NAD HEENT: anicteric, oropharynx clear Neck: supple, nontender Lungs:  Clear throughout to auscultation.   No wheezes, crackles, or rhonchi. No acute distress. Heart:  Regular rate and rhythm; no murmurs, clicks, rubs,  or gallops. Abdomen: epigastric tenderness with minimal guarding, otherwise nontender, soft,  nondistended, +BS  Rectal:  Deferred Ext: no edema  GI:  Lab Results:  Basename 09/14/12 0310 09/13/12 1553  WBC 10.8* 14.5*  HGB 7.7* 9.3*  HCT 23.9* 28.3*  PLT 376 486*   BMET  Basename 09/14/12 0310 09/13/12 1553  NA 138 137  K 4.1 4.0  CL 101 96  CO2 30 28  GLUCOSE 122* 102*  BUN 18 24*  CREATININE 0.85 0.84  CALCIUM 9.8 11.5*   LFT  Basename 09/14/12 0310  PROT 6.6  ALBUMIN 2.9*  AST 36  ALT 37*  ALKPHOS 196*  BILITOT 0.2*  BILIDIR --  IBILI --   PT/INR No results found for this basename: LABPROT:2,INR:2 in the last 72  hours   Studies/Results: Ct Abdomen Pelvis W Contrast  09/13/2012  *RADIOLOGY REPORT*  Clinical Data: Bloody stools.  Weight loss.  Dizziness.  CT ABDOMEN AND PELVIS WITH CONTRAST  Technique:  Multidetector CT imaging of the abdomen and pelvis was performed following the standard protocol during bolus administration of intravenous contrast.  Contrast: OMNIPAQUE IOHEXOL 300 MG/ML  SOLN  Comparison: None.  Findings: Lung bases are abnormal with bronchial thickening and pulmonary scarring or atelectasis.  This does not represent a thorough evaluation.  No pleural or pericardial fluid.  The liver has a normal appearance without focal lesions or biliary ductal dilatation.  No calcified gallstones.  The spleen is normal. The pancreas is normal.  The adrenal glands are normal with the exception that there could possibly be an adenoma within the right adrenal, though this is not clearly demonstrated because it is immediately adjacent to the liver.  I do not suspect a significant lesion.  Renal parenchyma is normal bilaterally.  No cyst, mass, stone or hydronephrosis.  The aorta and its branches shows some atherosclerotic disease but there is no aneurysm.  The IVC is normal.  No retroperitoneal mass or adenopathy.  No free fluid or air.  The bladder is normal.  Uterus and adnexal regions are normal.  No acute bowel pathology is definitely evident.  One could question some thickening along the posterior rectum and a mucosal lesion in that area is not excluded.  IMPRESSION: No definite abnormality by CT.  Question of mucosal thickening at the posterior rectum, but this is not a reliable finding using this technique.  Abnormal lung bases with bronchial thickening and some scattered pulmonary densities.  There could be chronic bronchitis or other chronic lung disease.  This is not fully or completely evaluated.   Original Report Authenticated By: Paulina Fusi, M.D.     Impression/Plan: 57 yo woman with 3 days of  melena and symptomatic anemia from this GI bleed. Concerning for peptic ulcer bleed. EGD this morning. Continue Protonix drip. Transfuse as needed.    LOS: 1 day   Aarit Kashuba C.  09/14/2012, 9:03 AM

## 2012-09-14 NOTE — Progress Notes (Signed)
[facility] PROGRESS NOTE  Rachel Banks LKG:[id number] DOB: 02-04-1956 DOA: 09/13/2012 PCP: Default, Provider, MD  Assessment/Plan:  GI bleed with acute blood loss anemia. GI Consultation and endoscopy appreciated. Dieulafoy lesion found and injected with epinephrine.  Did not appear to be actively bleeding. BID protonix.  Advance diet tomorrow  Acute blood loss anemia, symptomatic She has received 2 units of PRBCs Follow CBC.   Elevated alkaline Phosphatase Uncertain etiology Will fractionate alkphos.  Appears stable to follow as an outpatient.  Question chronic bronchitis Stable for outpatient work up.  Patient is asymptomatic.    Code Status: full Family Communication:  Disposition Plan: to home when appropriate   Consultants:  Gastroenterology  Procedures:  Upper Endoscopy  Antibiotics:  HPI/Subjective: Patient had one melenotic stool this am.  Denies any pain.  Started having 1 black stool daily Tuesday morning.  Objective: Filed Vitals:   09/14/12 0950 09/14/12 0955 09/14/12 1000 09/14/12 1006  BP: 151/102  154/99 145/89  Pulse:      Temp:  98.6 F (37 C)    TempSrc:      Resp: 99  75 67  Height:      Weight:      SpO2: 100%  98% 98%    Intake/Output Summary (Last 24 hours) at 09/14/12 1324 Last data filed at 09/14/12 0559  Gross per 24 hour  Intake    610 ml  Output      0 ml  Net    610 ml   Filed Weights   09/13/12 2331 09/13/12 2357  Weight: 76.522 kg (168 lb 11.2 oz) 75.7 kg (166 lb 14.2 oz)    Exam:   General:  WD, WN, AA Female, lying comfortably in bed.    Cardiovascular: rrr, no m/r/g  Respiratory: cta no w/c/r  Abdomen: Obese, soft, nt, nd, +BS, no masses  Skin: no rashes, bruises, or lesions.  Data Reviewed: Basic Metabolic Panel:  Lab 09/14/12 6644 09/13/12 1553  NA 138 137  K 4.1 4.0  CL 101 96  CO2 30 28  GLUCOSE 122* 102*  BUN 18 24*  CREATININE 0.85 0.84  CALCIUM 9.8 11.5*  MG -- --  PHOS --  --   Liver Function Tests:  Lab 09/14/12 0310 09/13/12 1553  AST 36 45*  ALT 37* 49*  ALKPHOS 196* 256*  BILITOT 0.2* 0.2*  PROT 6.6 8.4*  ALBUMIN 2.9* 3.7   CBC:  Lab 09/14/12 1027 09/14/12 0310 09/13/12 1553  WBC 9.9 10.8* 14.5*  NEUTROABS -- -- --  HGB 10.3* 7.7* 9.3*  HCT 30.8* 23.9* 28.3*  MCV 82.8 81.0 81.1  PLT 330 376 486*     Studies: Ct Abdomen Pelvis W Contrast  09/13/2012  *RADIOLOGY REPORT*  Clinical Data: Bloody stools.  Weight loss.  Dizziness.  CT ABDOMEN AND PELVIS WITH CONTRAST  Technique:  Multidetector CT imaging of the abdomen and pelvis was performed following the standard protocol during bolus administration of intravenous contrast.  Contrast: OMNIPAQUE IOHEXOL 300 MG/ML  SOLN  Comparison: None.  Findings: Lung bases are abnormal with bronchial thickening and pulmonary scarring or atelectasis.  This does not represent a thorough evaluation.  No pleural or pericardial fluid.  The liver has a normal appearance without focal lesions or biliary ductal dilatation.  No calcified gallstones.  The spleen is normal. The pancreas is normal.  The adrenal glands are normal with the exception that there could possibly be an adenoma within the right adrenal, though this is  not clearly demonstrated because it is immediately adjacent to the liver.  I do not suspect a significant lesion.  Renal parenchyma is normal bilaterally.  No cyst, mass, stone or hydronephrosis.  The aorta and its branches shows some atherosclerotic disease but there is no aneurysm.  The IVC is normal.  No retroperitoneal mass or adenopathy.  No free fluid or air.  The bladder is normal.  Uterus and adnexal regions are normal.  No acute bowel pathology is definitely evident.  One could question some thickening along the posterior rectum and a mucosal lesion in that area is not excluded.  IMPRESSION: No definite abnormality by CT.  Question of mucosal thickening at the posterior rectum, but this is not a  reliable finding using this technique.  Abnormal lung bases with bronchial thickening and some scattered pulmonary densities.  There could be chronic bronchitis or other chronic lung disease.  This is not fully or completely evaluated.   Original Report Authenticated By: Paulina Fusi, M.D.     Scheduled Meds:   . furosemide  20 mg Intravenous Once  . pantoprazole (PROTONIX) IV  40 mg Intravenous Q12H   Continuous Infusions:   Principal Problem:  *GI bleed Active Problems:  Acute blood loss anemia  Elevated transaminase level  Melena    Time spent: 35 min.    Conley Canal [facility] Pager [phone]. If 8PM-8AM, please contact night-coverage at [email or web address], password [facility] 09/14/2012, 1:24 PM  LOS: 1 day    Attending Patient seen and examined, admitted with melena. EGD shows a Dieulafoy lesion. This was injected with epinephrine. Currently status post 2 units of PRBC transfusion. We'll check frequent hemoglobins and hematocrits. Continue PPI infusion, appreciate GI evaluation.  S Ghimire

## 2012-09-14 NOTE — Progress Notes (Signed)
Agree with dietetic intern note.   Urias Sheek Kowalski RD, LDN Pager #[phone] After Hours pager #[phone]  

## 2012-09-14 NOTE — Interval H&P Note (Signed)
History and Physical Interval Note:  09/14/2012 9:02 AM  Rachel Banks  has presented today for surgery, with the diagnosis of melena  The various methods of treatment have been discussed with the patient and family. After consideration of risks, benefits and other options for treatment, the patient has consented to  Procedure(s) (LRB) with comments: ESOPHAGOGASTRODUODENOSCOPY (EGD) (N/A) as a surgical intervention .  The patient's history has been reviewed, patient examined, no change in status, stable for surgery.  I have reviewed the patient's chart and labs.  Questions were answered to the patient's satisfaction.     Mackenna Kamer C.

## 2012-09-14 NOTE — Op Note (Signed)
[facility] [address] [place], [address]   ENDOSCOPY PROCEDURE REPORT  PATIENT: Rachel, Banks  MR#: [id number] BIRTHDATE: [birth year] , 56  yrs. old GENDER: Female  ENDOSCOPIST: Charlott Rakes, MD REFERRED [id number] team  PROCEDURE DATE:  09/14/2012 PROCEDURE:   EGD w/ control of bleeding ASA CLASS:   Class II INDICATIONS:Melena. MEDICATIONS: Fentanyl 50 mcg IV, Versed 5 mg IV, and Cetacaine spray x 2  TOPICAL ANESTHETIC:  DESCRIPTION OF PROCEDURE:   After the risks benefits and alternatives of the procedure were thoroughly explained, informed consent was obtained.  The Pentax Gastroscope [id number]  endoscope was introduced through the mouth and advanced to the second portion of the duodenum , limited by Without limitations.   The instrument was slowly withdrawn as the mucosa was fully examined.     FINDINGS: The endoscope was inserted into the oropharynx and esophagus was intubated.  The esophagus was normal in appearance. The gastroesophageal junction was noted to be 40 cm from the incisors. Endoscope was advanced into the stomach which revealed normal appearing body and antrum of the stomach with clear fluid. The endoscope was advanced to the duodenal bulb and second portion of duodenum which were unremarkable.  The endoscope was withdrawn back into the stomach and retroflexion identified minimal erythema in the lesser curvature of the stomach. In the fundus a slightly raised red spot was seen with a small amount of surrounding red blood that could not be irrigated away. This spot appeared to be Dieulafoy-like but was not actively bleeding. No other mucosal abnormality was seen on retroflexion and no Mallory-Weiss tear was noted. Epinephrine:saline 1:10,000U mixture was injected submucosally near this spot with good blanching seen and a total of 4 cc was injected. No black liquid or blood products were seen except as noted  above.  COMPLICATIONS: None  ENDOSCOPIC IMPRESSION:     Small raised red spot in fundus that is Dieulafoy-like in appearance -s/p epi injection Minimal gastritis No ulcer noted and no active bleeding seen  RECOMMENDATIONS: Change Protonix to IV q 12 hours; Clear liquids today and if ok advance tomorrow; Follow H/Hs.   REPEAT EXAM: N/A  _______________________________ Charlott Rakes, MD eSigned:  Charlott Rakes, MD 09/14/2012 9:42 AM    CC:  PATIENT NAME:  Rachel, Banks MR#: [id number]

## 2012-09-14 NOTE — Brief Op Note (Signed)
Small red spot in fundus that appeared to be small Dieulafoy without active bleeding. S/P epi injection. See endopro for details. Will change to IV PPI Q 12 hours. Clears only today and if ok advance tomorrow.

## 2012-09-15 ENCOUNTER — Encounter (HOSPITAL_COMMUNITY): Payer: Self-pay | Admitting: Gastroenterology

## 2012-09-15 DIAGNOSIS — K921 Melena: Secondary | ICD-10-CM

## 2012-09-15 LAB — CBC
HCT: 30.6 % — ABNORMAL LOW (ref 36.0–46.0)
Hemoglobin: 10.2 g/dL — ABNORMAL LOW (ref 12.0–15.0)
MCH: 27.5 pg (ref 26.0–34.0)
MCHC: 33.3 g/dL (ref 30.0–36.0)
MCV: 82.5 fL (ref 78.0–100.0)
Platelets: 316 K/uL (ref 150–400)
RBC: 3.71 MIL/uL — ABNORMAL LOW (ref 3.87–5.11)
RDW: 14.8 % (ref 11.5–15.5)
WBC: 10.1 K/uL (ref 4.0–10.5)

## 2012-09-15 LAB — COMPREHENSIVE METABOLIC PANEL WITH GFR
ALT: 34 U/L (ref 0–35)
AST: 34 U/L (ref 0–37)
Albumin: 2.8 g/dL — ABNORMAL LOW (ref 3.5–5.2)
Alkaline Phosphatase: 182 U/L — ABNORMAL HIGH (ref 39–117)
BUN: 9 mg/dL (ref 6–23)
CO2: 29 meq/L (ref 19–32)
Calcium: 9.3 mg/dL (ref 8.4–10.5)
Chloride: 102 meq/L (ref 96–112)
Creatinine, Ser: 0.88 mg/dL (ref 0.50–1.10)
GFR calc Af Amer: 84 mL/min — ABNORMAL LOW
GFR calc non Af Amer: 72 mL/min — ABNORMAL LOW
Glucose, Bld: 104 mg/dL — ABNORMAL HIGH (ref 70–99)
Potassium: 3.6 meq/L (ref 3.5–5.1)
Sodium: 138 meq/L (ref 135–145)
Total Bilirubin: 0.2 mg/dL — ABNORMAL LOW (ref 0.3–1.2)
Total Protein: 6.4 g/dL (ref 6.0–8.3)

## 2012-09-15 LAB — ALKALINE PHOSPHATASE, ISOENZYMES
ALP, Heat Stable (Liver): 140 U/L
Alk Phos Bone Fract: 38 U/L
Alk Phos Liver Fract: 79 %
Alk Phos: 178 U/L — ABNORMAL HIGH (ref 39–117)

## 2012-09-15 LAB — TYPE AND SCREEN

## 2012-09-15 MED ORDER — ACETAMINOPHEN 325 MG PO TABS: 650.0000 mg | ORAL_TABLET | Freq: Four times a day (QID) | ORAL | Status: AC | PRN

## 2012-09-15 MED ORDER — PANTOPRAZOLE SODIUM 40 MG PO TBEC: 40.0000 mg | DELAYED_RELEASE_TABLET | Freq: Every day | ORAL | Status: DC

## 2012-09-15 NOTE — Progress Notes (Signed)
Rachel Banks 8:34 AM  Subjective: Patient without complaints and without signs of bleeding and no problem from her endoscopy and I explained the findings  Objective: Vital signs stable afebrile no acute distress abdomen is soft nontender hemoglobin slight decrease but BUN decreased as well  Assessment: Resolve GI bleeding Plan: Patient is about to eat a regular diet and will probably go home soon and I have recommended no aspirin or nonsteroidal and Tylenol only and followup with my partner Dr. Bosie Clos in 2 weeks and call us sooner when necessary and continue once a day pump inhibitor at home and call me this weekend if any other question or problem Rachel Banks E

## 2012-09-15 NOTE — Care Management Note (Signed)
    Page 1 of 1   09/15/2012     3:08:21 PM   CARE MANAGEMENT NOTE 09/15/2012  Patient:  Rachel Banks, Rachel Banks   Account Number:  [id number]  Date Initiated:  09/15/2012  Documentation initiated by:  Letha Cape  Subjective/Objective Assessment:   dx gib  admit-lives with spouse.  pta independent.     Action/Plan:   Anticipated DC Date:  09/15/2012   Anticipated DC Plan:  HOME/SELF CARE      DC Planning Services  CM consult      Choice offered to / List presented to:             Status of service:  Completed, signed off Medicare Important Message given?   (If response is "NO", the following Medicare IM given date fields will be blank) Date Medicare IM given:   Date Additional Medicare IM given:    Discharge Disposition:  HOME/SELF CARE  Per UR Regulation:  Reviewed for med. necessity/level of care/duration of stay  If discussed at Long Length of Stay Meetings, dates discussed:    Comments:  09/15/12 15:07 Letha Cape RN, BSN [phone] patient lives with spouse, patient dc to home today, no needs anticipated.

## 2012-09-15 NOTE — Progress Notes (Signed)
Pt. discharged to floor,verbalized understanding of discharged instruction,medication,restriction,diet and follow up appointment.Baseline Vitals sign stable,Pt comfortable,no sign and symptom of distress. 

## 2012-09-15 NOTE — Discharge Summary (Signed)
PATIENT DETAILS Name: Rachel Banks Age: 57 y.o. Sex: female Date of Birth: 11-24-1955 MRN: [id number]. Admit Date: 09/13/2012 Admitting Physician: Standley Brooking, MD JOA:CZYSAYT,KZSWF, MD  Recommendations for Outpatient Follow-up:  Please check CBC at next visit with primary care practitioner. Followup LFTs- to the normalize-if not, further workup can be done in the outpatient setting  PRIMARY DISCHARGE DIAGNOSIS:  Principal Problem:  *GI bleed Active Problems:  Acute blood loss anemia  Elevated transaminase level  Melena      PAST MEDICAL HISTORY: Past Medical History  Diagnosis Date  . Panic attacks   . Hypertension   . Hyperlipidemia   . Obesity   . Seasonal allergies   . Insulin resistance   . Elevated LFTs   . Endometriosis   . PUD (peptic ulcer disease)     DISCHARGE MEDICATIONS:   Medication List     As of 09/15/2012 10:47 AM    TAKE these medications         acetaminophen 325 MG tablet   Commonly known as: TYLENOL   Take 2 tablets (650 mg total) by mouth every 6 (six) hours as needed (or Fever >/= 101).      amLODipine 5 MG tablet   Commonly known as: NORVASC   Take 5 mg by mouth daily.      cetirizine 10 MG tablet   Commonly known as: ZYRTEC   Take 10 mg by mouth daily.      hydrochlorothiazide 25 MG tablet   Commonly known as: HYDRODIURIL   Take 25 mg by mouth daily.      MULTIVITAMIN PO   Take 1 tablet by mouth daily.      pantoprazole 40 MG tablet   Commonly known as: PROTONIX   Take 1 tablet (40 mg total) by mouth daily.         BRIEF HPI:  See H&P, Labs, Consult and Test reports for all details in brief, patient is a 57 year old female with a history of hypertension who presented to the emergency department on 1/29 for melena. She had a hemoglobin of 9.3. She was then referred to the hospitalist service for further evaluation and treatment.  CONSULTATIONS:   Gastroenterology  PERTINENT RADIOLOGIC STUDIES: Ct Abdomen  Pelvis W Contrast  09/13/2012  *RADIOLOGY REPORT*  Clinical Data: Bloody stools.  Weight loss.  Dizziness.  CT ABDOMEN AND PELVIS WITH CONTRAST  Technique:  Multidetector CT imaging of the abdomen and pelvis was performed following the standard protocol during bolus administration of intravenous contrast.  Contrast: OMNIPAQUE IOHEXOL 300 MG/ML  SOLN  Comparison: None.  Findings: Lung bases are abnormal with bronchial thickening and pulmonary scarring or atelectasis.  This does not represent a thorough evaluation.  No pleural or pericardial fluid.  The liver has a normal appearance without focal lesions or biliary ductal dilatation.  No calcified gallstones.  The spleen is normal. The pancreas is normal.  The adrenal glands are normal with the exception that there could possibly be an adenoma within the right adrenal, though this is not clearly demonstrated because it is immediately adjacent to the liver.  I do not suspect a significant lesion.  Renal parenchyma is normal bilaterally.  No cyst, mass, stone or hydronephrosis.  The aorta and its branches shows some atherosclerotic disease but there is no aneurysm.  The IVC is normal.  No retroperitoneal mass or adenopathy.  No free fluid or air.  The bladder is normal.  Uterus and adnexal regions are normal.  No acute bowel pathology is definitely evident.  One could question some thickening along the posterior rectum and a mucosal lesion in that area is not excluded.  IMPRESSION: No definite abnormality by CT.  Question of mucosal thickening at the posterior rectum, but this is not a reliable finding using this technique.  Abnormal lung bases with bronchial thickening and some scattered pulmonary densities.  There could be chronic bronchitis or other chronic lung disease.  This is not fully or completely evaluated.   Original Report Authenticated By: Paulina Fusi, M.D.      PERTINENT LAB RESULTS: CBC:  Basename 09/15/12 0307 09/14/12 1840  WBC 10.1 10.5   HGB 10.2* 11.0*  HCT 30.6* 33.3*  PLT 316 359   CMET CMP     Component Value Date/Time   NA 138 09/15/2012 0307   K 3.6 09/15/2012 0307   CL 102 09/15/2012 0307   CO2 29 09/15/2012 0307   GLUCOSE 104* 09/15/2012 0307   BUN 9 09/15/2012 0307   CREATININE 0.88 09/15/2012 0307   CALCIUM 9.3 09/15/2012 0307   PROT 6.4 09/15/2012 0307   ALBUMIN 2.8* 09/15/2012 0307   AST 34 09/15/2012 0307   ALT 34 09/15/2012 0307   ALKPHOS 182* 09/15/2012 0307   BILITOT 0.2* 09/15/2012 0307   GFRNONAA 72* 09/15/2012 0307   GFRAA 84* 09/15/2012 0307    GFR Estimated Creatinine Clearance: 71.1 ml/min (by C-G formula based on Cr of 0.88). No results found for this basename: LIPASE:2,AMYLASE:2 in the last 72 hours No results found for this basename: CKTOTAL:3,CKMB:3,CKMBINDEX:3,TROPONINI:3 in the last 72 hours No components found with this basename: POCBNP:3 No results found for this basename: DDIMER:2 in the last 72 hours No results found for this basename: HGBA1C:2 in the last 72 hours No results found for this basename: CHOL:2,HDL:2,LDLCALC:2,TRIG:2,CHOLHDL:2,LDLDIRECT:2 in the last 72 hours No results found for this basename: TSH,T4TOTAL,FREET3,T3FREE,THYROIDAB in the last 72 hours No results found for this basename: VITAMINB12:2,FOLATE:2,FERRITIN:2,TIBC:2,IRON:2,RETICCTPCT:2 in the last 72 hours Coags:  Basename 09/14/12 1026  INR 1.09   Microbiology: No results found for this or any previous visit (from the past 240 hour(s)).   BRIEF HOSPITAL COURSE:   Principal Problem:  *GI bleed - This is upper GI bleed, she on admission was started on a Protonix infusion. She was given 2 units of PRBC. 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 - Her  hemoglobin is stable posttransfusion, on discharge she will be transitioned to oral daily Protonix. - She has been asked to call [facility] gastroenterology in 2 weeks for a follow up appointment, a appointment with her primary care practitioner has been set up for next week. - She has been asked to avoid over-the-counter nonsteroidal anti-inflammatory medications. She's been told to take Tylenol for pain  Active Problems:  Acute blood loss anemia - This is secondary to above - She was transfused 2 units of PRBC, on admission her hemoglobin was 9.3, however it did drop to 7.7 on 1/30. She was subsequently transfused 2 units of PRBC, her hemoglobin on discharge is 10.2. With her melena completely resolving and she had a brown stool, it is felt that she has no longer further bleeding from the above-noted lesion on endoscopy. -She should have  a CBC checked next week when she follows up with her primary care practitioner.\   Elevated transaminase level - This was of unknown etiology, on admission she had very mild AST and ALT elevation, these have now subsequently normalized. Her alkaline phosphatase is only very mildly high at 182 at the time of discharge. - Further workup if at all-can be done in the outpatient setting. We'll defer to her primary care practitioner  Hypertension - Resume her prior antihypertensive medications on discharge.  TODAY-DAY OF DISCHARGE:  Subjective:   Shelby Peltz today has no headache,no chest abdominal pain,no new weakness tingling or numbness, feels much better wants to go home today.   Objective:   Blood pressure 128/71, pulse 87, temperature 98.1 F (36.7 C), temperature source Oral, resp. rate 20, height 5\' 4"  (1.626 m), weight 75.7 kg (166 lb 14.2 oz), SpO2 97.00%.  Intake/Output Summary (Last 24 hours) at 09/15/12 1047 Last data filed at 09/14/12 1700  Gross per 24 hour  Intake    360 ml  Output      0 ml  Net    360 ml    Exam Awake Alert, Oriented  *3, No new F.N deficits, Normal affect [place].AT,PERRAL Supple Neck,No JVD, No cervical lymphadenopathy appriciated.  Symmetrical Chest wall movement, Good air movement bilaterally, CTAB RRR,No Gallops,Rubs or new Murmurs, No Parasternal Heave +ve B.Sounds, Abd Soft, Non tender, No organomegaly appriciated, No rebound -guarding or rigidity. No Cyanosis, Clubbing or edema, No new Rash or bruise  DISCHARGE CONDITION: Stable  DISPOSITION: HOME  DISCHARGE INSTRUCTIONS:    Activity:  As tolerated   Diet recommendation: Heart Healthy diet       Follow-up Information    Follow up with Darrow Bussing, MD. Schedule an appointment as soon as possible for a visit on 10/20/2012. (appointment at 10:45)    Contact information:   [address] [place] [address] [phone]       Follow up with Shirley Friar., MD. Schedule an appointment as soon as possible for a visit in 2 weeks.   Contact information:   [address] [place] [address] [phone]            Total Time spent on discharge equals 45 minutes.  SignedJeoffrey Massed 09/15/2012 10:47 AM

## 2012-10-12 ENCOUNTER — Other Ambulatory Visit (HOSPITAL_COMMUNITY): Payer: Self-pay | Admitting: Family Medicine

## 2012-10-12 ENCOUNTER — Other Ambulatory Visit: Payer: Self-pay | Admitting: Family Medicine

## 2012-10-12 DIAGNOSIS — M25562 Pain in left knee: Secondary | ICD-10-CM

## 2012-10-12 DIAGNOSIS — M7989 Other specified soft tissue disorders: Secondary | ICD-10-CM

## 2012-10-12 DIAGNOSIS — M79605 Pain in left leg: Secondary | ICD-10-CM

## 2012-10-13 ENCOUNTER — Ambulatory Visit (HOSPITAL_COMMUNITY)
Admission: RE | Admit: 2012-10-13 | Discharge: 2012-10-13 | Disposition: A | Discharge status: Home / Self Care | Payer: BC Managed Care – PPO | Source: Ambulatory Visit | Attending: Family Medicine | Admitting: Family Medicine

## 2012-10-13 ENCOUNTER — Other Ambulatory Visit: Payer: BC Managed Care – PPO

## 2012-10-13 DIAGNOSIS — M25562 Pain in left knee: Secondary | ICD-10-CM

## 2012-10-13 DIAGNOSIS — M79609 Pain in unspecified limb: Secondary | ICD-10-CM

## 2012-10-13 DIAGNOSIS — I824Z9 Acute embolism and thrombosis of unspecified deep veins of unspecified distal lower extremity: Secondary | ICD-10-CM | POA: Insufficient documentation

## 2012-10-13 DIAGNOSIS — M7989 Other specified soft tissue disorders: Secondary | ICD-10-CM

## 2012-10-13 NOTE — Progress Notes (Signed)
Right:  No evidence of DVT, superficial thrombosis, or Baker's cyst.  Left: DVT noted in the posterior tibial vein.  No evidence of superficial thrombosis.  No Baker's cyst.

## 2013-02-27 ENCOUNTER — Other Ambulatory Visit: Payer: Self-pay

## 2013-02-27 DIAGNOSIS — Z1231 Encounter for screening mammogram for malignant neoplasm of breast: Secondary | ICD-10-CM

## 2013-03-22 ENCOUNTER — Ambulatory Visit
Admission: RE | Admit: 2013-03-22 | Discharge: 2013-03-22 | Disposition: A | Discharge status: Home / Self Care | Payer: BC Managed Care – PPO | Source: Ambulatory Visit

## 2013-03-22 DIAGNOSIS — Z1231 Encounter for screening mammogram for malignant neoplasm of breast: Secondary | ICD-10-CM

## 2013-04-09 ENCOUNTER — Ambulatory Visit (INDEPENDENT_AMBULATORY_CARE_PROVIDER_SITE_OTHER): Payer: BC Managed Care – PPO | Admitting: Neurology

## 2013-04-09 ENCOUNTER — Ambulatory Visit (INDEPENDENT_AMBULATORY_CARE_PROVIDER_SITE_OTHER): Payer: BC Managed Care – PPO

## 2013-04-09 DIAGNOSIS — R209 Unspecified disturbances of skin sensation: Secondary | ICD-10-CM

## 2013-04-09 DIAGNOSIS — Z0289 Encounter for other administrative examinations: Secondary | ICD-10-CM

## 2013-04-09 DIAGNOSIS — G544 Lumbosacral root disorders, not elsewhere classified: Secondary | ICD-10-CM

## 2013-04-09 NOTE — Procedures (Signed)
HISTORY:  Rachel Banks is a 57 year old patient with a history of left hip discomfort while playing golf. The patient reports some numbness around the ankles bilaterally following a DVT of the left leg approximately one year ago. The patient reports no significant pain from the back down the legs. The patient is being evaluated for a possible neuropathy or a lumbosacral radiculopathy. The patient denies any actual numbness of the feet.  NERVE CONDUCTION STUDIES:  Nerve conduction studies were performed on both lower extremities. The distal motor latencies and motor amplitudes for the peroneal and posterior tibial nerves were within normal limits. The nerve conduction velocities for these nerves were also normal. The H reflex latencies were normal. The sensory latencies for the peroneal nerves were within normal limits. The medial and lateral plantar sensory latencies were within normal limits and symmetric from one side to the next.   EMG STUDIES:  EMG study was performed on the left lower extremity:  The tibialis anterior muscle reveals 2 to 4K motor units with full recruitment. No fibrillations or positive waves were seen. The peroneus tertius muscle reveals 2 to 4K motor units with full recruitment. No fibrillations or positive waves were seen. The medial gastrocnemius muscle reveals 1 to 3K motor units with full recruitment. No fibrillations or positive waves were seen. The vastus lateralis muscle reveals 2 to 4K motor units with full recruitment. One brief run of positive waves were seen. The iliopsoas muscle reveals 2 to 4K motor units with full recruitment. No fibrillations or positive waves were seen. The biceps femoris muscle (long head) reveals 2 to 4K motor units with full recruitment. No fibrillations or positive waves were seen. The lumbosacral paraspinal muscles were tested at 3 levels, and revealed  2+ fibrillations and positive waves at all 3 levels tested. There was good  relaxation.  EMG study was performed on the right lower extremity:  The tibialis anterior muscle reveals 2 to 4K motor units with full recruitment. No fibrillations or positive waves were seen. The peroneus tertius muscle reveals 2 to 4K motor units with full recruitment. No fibrillations or positive waves were seen. The medial gastrocnemius muscle reveals 1 to 3K motor units with full recruitment. No fibrillations or positive waves were seen. The vastus lateralis muscle reveals 2 to 4K motor units with full recruitment. No fibrillations or positive waves were seen. The iliopsoas muscle reveals 2 to 4K motor units with full recruitment. No fibrillations or positive waves were seen. The biceps femoris muscle (long head) reveals 2 to 4K motor units with full recruitment. No fibrillations or positive waves were seen. The lumbosacral paraspinal muscles were tested at 3 levels, and revealed no abnormalities of insertional activity at the upper level tested. One plus fibrillations and positive waves were seen at the middle and lower levels. There was fair relaxation.   IMPRESSION:  Nerve conduction studies done on both lower extremities were within normal limits. There is no evidence of a peripheral neuropathy. EMG evaluation of the left lower extremity shows active denervation of the lumbosacral paraspinal muscles. Evaluation of the left lower extremity itself was relatively unremarkable. Similar findings were seen on the right side, with a normal evaluation of the right lower extremity, and some minimal denervation of the lumbosacral paraspinal muscles. 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 Clinical correlation is required.  Marlan Palau MD 04/09/2013 12:45 PM  [facility] [address] [place], [place] [address]  Phone [phone] Fax  [phone]  

## 2013-12-31 ENCOUNTER — Other Ambulatory Visit: Payer: Self-pay | Admitting: Family Medicine

## 2013-12-31 DIAGNOSIS — M5430 Sciatica, unspecified side: Secondary | ICD-10-CM

## 2014-01-05 ENCOUNTER — Other Ambulatory Visit: Payer: BC Managed Care – PPO

## 2014-01-10 ENCOUNTER — Other Ambulatory Visit: Payer: Self-pay | Admitting: Family Medicine

## 2014-01-10 DIAGNOSIS — R945 Abnormal results of liver function studies: Principal | ICD-10-CM

## 2014-01-10 DIAGNOSIS — R7989 Other specified abnormal findings of blood chemistry: Secondary | ICD-10-CM

## 2014-01-24 ENCOUNTER — Ambulatory Visit
Admission: RE | Admit: 2014-01-24 | Discharge: 2014-01-24 | Disposition: A | Discharge status: Home / Self Care | Payer: BC Managed Care – PPO | Source: Ambulatory Visit | Attending: Family Medicine | Admitting: Family Medicine

## 2014-01-24 DIAGNOSIS — R945 Abnormal results of liver function studies: Principal | ICD-10-CM

## 2014-01-24 DIAGNOSIS — R7989 Other specified abnormal findings of blood chemistry: Secondary | ICD-10-CM

## 2014-01-30 ENCOUNTER — Other Ambulatory Visit: Payer: Self-pay | Admitting: Gastroenterology

## 2014-01-30 DIAGNOSIS — R7402 Elevation of levels of lactic acid dehydrogenase (LDH): Secondary | ICD-10-CM

## 2014-01-30 DIAGNOSIS — R740 Nonspecific elevation of levels of transaminase and lactic acid dehydrogenase [LDH]: Principal | ICD-10-CM

## 2014-01-30 DIAGNOSIS — R7401 Elevation of levels of liver transaminase levels: Secondary | ICD-10-CM

## 2014-02-01 ENCOUNTER — Ambulatory Visit
Admission: RE | Admit: 2014-02-01 | Discharge: 2014-02-01 | Disposition: A | Discharge status: Home / Self Care | Payer: BC Managed Care – PPO | Source: Ambulatory Visit | Attending: Gastroenterology | Admitting: Gastroenterology

## 2014-02-01 DIAGNOSIS — R740 Nonspecific elevation of levels of transaminase and lactic acid dehydrogenase [LDH]: Principal | ICD-10-CM

## 2014-02-01 DIAGNOSIS — R7401 Elevation of levels of liver transaminase levels: Secondary | ICD-10-CM

## 2014-02-01 DIAGNOSIS — R7402 Elevation of levels of lactic acid dehydrogenase (LDH): Secondary | ICD-10-CM

## 2014-02-01 MED ORDER — IOHEXOL 300 MG/ML  SOLN
100.0000 mL | Freq: Once | INTRAMUSCULAR | Status: AC | PRN
  Administered 2014-02-01: 100 mL via INTRAVENOUS

## 2014-10-07 ENCOUNTER — Other Ambulatory Visit: Payer: Self-pay

## 2014-10-07 DIAGNOSIS — Z1231 Encounter for screening mammogram for malignant neoplasm of breast: Secondary | ICD-10-CM

## 2014-11-07 ENCOUNTER — Ambulatory Visit
Admission: RE | Admit: 2014-11-07 | Discharge: 2014-11-07 | Disposition: A | Discharge status: Home / Self Care | Payer: BLUE CROSS/BLUE SHIELD | Source: Ambulatory Visit

## 2014-11-07 ENCOUNTER — Ambulatory Visit: Payer: Self-pay

## 2014-11-07 DIAGNOSIS — Z1231 Encounter for screening mammogram for malignant neoplasm of breast: Secondary | ICD-10-CM

## 2014-11-08 ENCOUNTER — Ambulatory Visit: Payer: Self-pay

## 2017-06-07 ENCOUNTER — Other Ambulatory Visit: Payer: Self-pay | Admitting: Gastroenterology

## 2017-06-07 DIAGNOSIS — R748 Abnormal levels of other serum enzymes: Secondary | ICD-10-CM

## 2017-06-10 ENCOUNTER — Ambulatory Visit
Admission: RE | Admit: 2017-06-10 | Discharge: 2017-06-10 | Disposition: A | Discharge status: Home / Self Care | Payer: BLUE CROSS/BLUE SHIELD | Source: Ambulatory Visit | Attending: Gastroenterology | Admitting: Gastroenterology

## 2017-06-10 DIAGNOSIS — R748 Abnormal levels of other serum enzymes: Secondary | ICD-10-CM

## 2017-06-10 MED ORDER — IOPAMIDOL (ISOVUE-300) INJECTION 61%
100.0000 mL | Freq: Once | INTRAVENOUS | Status: AC | PRN
  Administered 2017-06-10: 100 mL via INTRAVENOUS

## 2017-08-04 ENCOUNTER — Other Ambulatory Visit: Payer: Self-pay | Admitting: Rheumatology

## 2017-08-04 DIAGNOSIS — I776 Arteritis, unspecified: Secondary | ICD-10-CM

## 2017-08-12 ENCOUNTER — Other Ambulatory Visit: Payer: Self-pay | Admitting: Obstetrics & Gynecology

## 2017-08-12 DIAGNOSIS — Z780 Asymptomatic menopausal state: Secondary | ICD-10-CM

## 2017-08-15 ENCOUNTER — Ambulatory Visit
Admission: RE | Admit: 2017-08-15 | Discharge: 2017-08-15 | Disposition: A | Discharge status: Home / Self Care | Payer: BLUE CROSS/BLUE SHIELD | Source: Ambulatory Visit | Attending: Rheumatology | Admitting: Rheumatology

## 2017-08-15 ENCOUNTER — Other Ambulatory Visit: Payer: Self-pay | Admitting: Rheumatology

## 2017-08-15 DIAGNOSIS — I776 Arteritis, unspecified: Secondary | ICD-10-CM

## 2017-08-15 MED ORDER — GADOBENATE DIMEGLUMINE 529 MG/ML IV SOLN
12.0000 mL | Freq: Once | INTRAVENOUS | Status: AC | PRN
  Administered 2017-08-15: 12 mL via INTRAVENOUS

## 2017-09-07 ENCOUNTER — Inpatient Hospital Stay
Admission: RE | Admit: 2017-09-07 | Discharge: 2017-09-07 | Disposition: A | Discharge status: Home / Self Care | Payer: BLUE CROSS/BLUE SHIELD | Source: Ambulatory Visit | Attending: Obstetrics & Gynecology | Admitting: Obstetrics & Gynecology

## 2017-09-23 ENCOUNTER — Ambulatory Visit
Admission: RE | Admit: 2017-09-23 | Discharge: 2017-09-23 | Disposition: A | Discharge status: Home / Self Care | Payer: BLUE CROSS/BLUE SHIELD | Source: Ambulatory Visit | Attending: Obstetrics & Gynecology | Admitting: Obstetrics & Gynecology

## 2017-09-23 DIAGNOSIS — Z780 Asymptomatic menopausal state: Secondary | ICD-10-CM

## 2018-01-18 ENCOUNTER — Other Ambulatory Visit: Payer: Self-pay | Admitting: Gastroenterology

## 2018-01-18 DIAGNOSIS — K7460 Unspecified cirrhosis of liver: Secondary | ICD-10-CM

## 2018-01-18 DIAGNOSIS — R188 Other ascites: Principal | ICD-10-CM

## 2018-04-23 ENCOUNTER — Inpatient Hospital Stay (HOSPITAL_BASED_OUTPATIENT_CLINIC_OR_DEPARTMENT_OTHER)
Admission: EM | Admit: 2018-04-23 | Discharge: 2018-04-25 | DRG: 391 | Disposition: A | Discharge status: Home / Self Care | Payer: BLUE CROSS/BLUE SHIELD | Attending: Family Medicine | Admitting: Family Medicine

## 2018-04-23 ENCOUNTER — Other Ambulatory Visit: Payer: Self-pay

## 2018-04-23 ENCOUNTER — Encounter (HOSPITAL_BASED_OUTPATIENT_CLINIC_OR_DEPARTMENT_OTHER): Payer: Self-pay | Admitting: Adult Health

## 2018-04-23 DIAGNOSIS — D72829 Elevated white blood cell count, unspecified: Secondary | ICD-10-CM | POA: Diagnosis present

## 2018-04-23 DIAGNOSIS — Z886 Allergy status to analgesic agent status: Secondary | ICD-10-CM | POA: Diagnosis not present

## 2018-04-23 DIAGNOSIS — K3189 Other diseases of stomach and duodenum: Principal | ICD-10-CM | POA: Diagnosis present

## 2018-04-23 DIAGNOSIS — R739 Hyperglycemia, unspecified: Secondary | ICD-10-CM

## 2018-04-23 DIAGNOSIS — K7460 Unspecified cirrhosis of liver: Secondary | ICD-10-CM | POA: Diagnosis present

## 2018-04-23 DIAGNOSIS — K2951 Unspecified chronic gastritis with bleeding: Secondary | ICD-10-CM | POA: Diagnosis present

## 2018-04-23 DIAGNOSIS — I10 Essential (primary) hypertension: Secondary | ICD-10-CM | POA: Diagnosis present

## 2018-04-23 DIAGNOSIS — R7989 Other specified abnormal findings of blood chemistry: Secondary | ICD-10-CM | POA: Diagnosis present

## 2018-04-23 DIAGNOSIS — D696 Thrombocytopenia, unspecified: Secondary | ICD-10-CM | POA: Diagnosis present

## 2018-04-23 DIAGNOSIS — K922 Gastrointestinal hemorrhage, unspecified: Secondary | ICD-10-CM | POA: Diagnosis present

## 2018-04-23 DIAGNOSIS — E785 Hyperlipidemia, unspecified: Secondary | ICD-10-CM | POA: Diagnosis present

## 2018-04-23 DIAGNOSIS — Z888 Allergy status to other drugs, medicaments and biological substances status: Secondary | ICD-10-CM | POA: Diagnosis not present

## 2018-04-23 DIAGNOSIS — D62 Acute posthemorrhagic anemia: Secondary | ICD-10-CM | POA: Diagnosis present

## 2018-04-23 DIAGNOSIS — E1165 Type 2 diabetes mellitus with hyperglycemia: Secondary | ICD-10-CM | POA: Diagnosis present

## 2018-04-23 DIAGNOSIS — K449 Diaphragmatic hernia without obstruction or gangrene: Secondary | ICD-10-CM | POA: Diagnosis present

## 2018-04-23 LAB — URINALYSIS, ROUTINE W REFLEX MICROSCOPIC
Bilirubin Urine: NEGATIVE
KETONES UR: NEGATIVE mg/dL
LEUKOCYTES UA: NEGATIVE
NITRITE: NEGATIVE
PH: 6 (ref 5.0–8.0)
Protein, ur: NEGATIVE mg/dL
Specific Gravity, Urine: <1.005 — ABNORMAL LOW (ref 1.005–1.030)

## 2018-04-23 LAB — BASIC METABOLIC PANEL
Anion gap: 11 (ref 5–15)
BUN: 36 mg/dL — AB (ref 8–23)
CO2: 27 mmol/L (ref 22–32)
Calcium: 9.3 mg/dL (ref 8.9–10.3)
Chloride: 99 mmol/L (ref 98–111)
Creatinine, Ser: 0.73 mg/dL (ref 0.44–1.00)
Glucose, Bld: 279 mg/dL — ABNORMAL HIGH (ref 70–99)
Potassium: 3.7 mmol/L (ref 3.5–5.1)
SODIUM: 137 mmol/L (ref 135–145)

## 2018-04-23 LAB — GLUCOSE, CAPILLARY
GLUCOSE-CAPILLARY: 109 mg/dL — AB (ref 70–99)
Glucose-Capillary: 155 mg/dL — ABNORMAL HIGH (ref 70–99)

## 2018-04-23 LAB — CBG MONITORING, ED: Glucose-Capillary: 269 mg/dL — ABNORMAL HIGH (ref 70–99)

## 2018-04-23 LAB — URINALYSIS, MICROSCOPIC (REFLEX): WBC UA: NONE SEEN WBC/hpf (ref 0–5)

## 2018-04-23 LAB — CBC
HCT: 24.7 % — ABNORMAL LOW (ref 36.0–46.0)
HEMATOCRIT: 22.7 % — AB (ref 36.0–46.0)
HEMOGLOBIN: 7.7 g/dL — AB (ref 12.0–15.0)
HEMOGLOBIN: 7 g/dL — AB (ref 12.0–15.0)
MCH: 27.8 pg (ref 26.0–34.0)
MCH: 27.8 pg (ref 26.0–34.0)
MCHC: 31.2 g/dL (ref 30.0–36.0)
MCHC: 30.8 g/dL (ref 30.0–36.0)
MCV: 89.2 fL (ref 78.0–100.0)
MCV: 90.1 fL (ref 78.0–100.0)
Platelets: 192 10*3/uL (ref 150–400)
Platelets: 155 10*3/uL (ref 150–400)
RBC: 2.77 MIL/uL — ABNORMAL LOW (ref 3.87–5.11)
RBC: 2.52 MIL/uL — ABNORMAL LOW (ref 3.87–5.11)
RDW: 15 % (ref 11.5–15.5)
RDW: 15.3 % (ref 11.5–15.5)
WBC: 20 10*3/uL — AB (ref 4.0–10.5)
WBC: 16.7 10*3/uL — ABNORMAL HIGH (ref 4.0–10.5)

## 2018-04-23 LAB — HEMOGLOBIN A1C
HEMOGLOBIN A1C: 7.4 % — AB (ref 4.8–5.6)
Mean Plasma Glucose: 165.68 mg/dL

## 2018-04-23 LAB — TSH: TSH: 3.224 u[IU]/mL (ref 0.350–4.500)

## 2018-04-23 LAB — PROTIME-INR
INR: 1.03
PROTHROMBIN TIME: 13.4 s (ref 11.4–15.2)

## 2018-04-23 LAB — HEMOGLOBIN AND HEMATOCRIT, BLOOD
HCT: 20.8 % — ABNORMAL LOW (ref 36.0–46.0)
Hemoglobin: 6.5 g/dL — CL (ref 12.0–15.0)

## 2018-04-23 LAB — OCCULT BLOOD X 1 CARD TO LAB, STOOL: FECAL OCCULT BLD: POSITIVE — AB

## 2018-04-23 MED ORDER — ONDANSETRON HCL 4 MG PO TABS: 4.0000 mg | ORAL_TABLET | Freq: Four times a day (QID) | ORAL | Status: DC | PRN

## 2018-04-23 MED ORDER — SODIUM CHLORIDE 0.9 % IV SOLN: INTRAVENOUS | Status: DC | PRN

## 2018-04-23 MED ORDER — PANTOPRAZOLE SODIUM 40 MG IV SOLR: 40.0000 mg | Freq: Two times a day (BID) | INTRAVENOUS | Status: DC

## 2018-04-23 MED ORDER — ONDANSETRON HCL 4 MG/2ML IJ SOLN: 4.0000 mg | Freq: Four times a day (QID) | INTRAMUSCULAR | Status: DC | PRN

## 2018-04-23 MED ORDER — SODIUM CHLORIDE 0.9% IV SOLUTION
Freq: Once | INTRAVENOUS | Status: AC
  Administered 2018-04-24: 05:00:00 via INTRAVENOUS

## 2018-04-23 MED ORDER — AMLODIPINE BESYLATE 5 MG PO TABS
5.0000 mg | ORAL_TABLET | Freq: Every day | ORAL | Status: DC
  Administered 2018-04-23 – 2018-04-25 (×2): 5 mg via ORAL
  Filled 2018-04-23 (×2): qty 1

## 2018-04-23 MED ORDER — SODIUM CHLORIDE 0.9 % IV SOLN
80.0000 mg | Freq: Once | INTRAVENOUS | Status: AC
  Administered 2018-04-23: 80 mg via INTRAVENOUS
  Filled 2018-04-23: qty 80

## 2018-04-23 MED ORDER — PANTOPRAZOLE SODIUM 40 MG IV SOLR
INTRAVENOUS | Status: AC
  Filled 2018-04-23: qty 160

## 2018-04-23 MED ORDER — ADULT MULTIVITAMIN LIQUID CH
Freq: Every day | ORAL | Status: DC
  Administered 2018-04-23 – 2018-04-25 (×3): 15 mL via ORAL
  Filled 2018-04-23 (×3): qty 15

## 2018-04-23 MED ORDER — LORATADINE 10 MG PO TABS
10.0000 mg | ORAL_TABLET | Freq: Every day | ORAL | Status: DC
  Administered 2018-04-23 – 2018-04-25 (×3): 10 mg via ORAL
  Filled 2018-04-23 (×3): qty 1

## 2018-04-23 MED ORDER — INSULIN ASPART 100 UNIT/ML ~~LOC~~ SOLN
0.0000 [IU] | SUBCUTANEOUS | Status: DC
  Administered 2018-04-23: 2 [IU] via SUBCUTANEOUS
  Administered 2018-04-24 – 2018-04-25 (×3): 1 [IU] via SUBCUTANEOUS

## 2018-04-23 MED ORDER — SODIUM CHLORIDE 0.9 % IV SOLN
INTRAVENOUS | Status: DC | PRN
  Administered 2018-04-23 – 2018-04-24 (×2): via INTRAVENOUS

## 2018-04-23 MED ORDER — SODIUM CHLORIDE 0.9 % IV SOLN
8.0000 mg/h | INTRAVENOUS | Status: DC
  Administered 2018-04-23 – 2018-04-24 (×2): 8 mg/h via INTRAVENOUS
  Filled 2018-04-23 (×4): qty 80

## 2018-04-23 NOTE — Progress Notes (Signed)
CRITICAL VALUE ALERT  Critical Value:  HGB 6.5  Date & Time Notied:  04/23/2018  2218  Provider Notified: Bruna Potter   Orders Received/Actions taken: Transfuse 2 units PRBC

## 2018-04-23 NOTE — Consult Note (Signed)
Subjective:   HPI  The patient is a 62 year old female who was transferred here today from [facility] emergency room after going there with complaints of weakness, shortness of breath, and almost fainting yesterday. She experienced melena yesterday and also some coffee-ground emesis yesterday. She took some Aleve yesterday. In reviewing records she had a GI bleed in 2014 and EGD showed a Dieulifoy lesion in the stomachReview of Systems No chest pain today feels better at this time.  Past Medical History:  Diagnosis Date  . Elevated LFTs   . Endometriosis   . Hyperlipidemia   . Hypertension   . Insulin resistance   . Obesity   . Panic attacks   . PUD (peptic ulcer disease)   . Seasonal allergies    Past Surgical History:  Procedure Laterality Date  . ABDOMINAL HYSTERECTOMY    . APPENDECTOMY    . ESOPHAGOGASTRODUODENOSCOPY  09/14/2012   Procedure: ESOPHAGOGASTRODUODENOSCOPY (EGD);  Surgeon: Shirley Friar, MD;  Location: [facility] ENDOSCOPY;  Service: Endoscopy;  Laterality: N/A;   Social History   Socioeconomic History  . Marital status: Married    Spouse name: Not on file  . Number of children: Not on file  . Years of education: Not on file  . Highest education level: Not on file  Occupational History  . Not on file  Social Needs  . Financial resource strain: Not on file  . Food insecurity:    Worry: Not on file    Inability: Not on file  . Transportation needs:    Medical: Not on file    Non-medical: Not on file  Tobacco Use  . Smoking status: Current Every Day Smoker    Packs/day: 0.50    Types: Cigarettes  Substance and Sexual Activity  . Alcohol use: No  . Drug use: No  . Sexual activity: Not on file  Lifestyle  . Physical activity:    Days per week: Not on file    Minutes per session: Not on file  . Stress: Not on file  Relationships  . Social connections:    Talks on phone: Not on file    Gets together: Not on file    Attends religious  service: Not on file    Active member of club or organization: Not on file    Attends meetings of clubs or organizations: Not on file    Relationship status: Not on file  . Intimate partner violence:    Fear of current or ex partner: Not on file    Emotionally abused: Not on file    Physically abused: Not on file    Forced sexual activity: Not on file  Other Topics Concern  . Not on file  Social History Narrative  . Not on file   family history is not on file.  Current Facility-Administered Medications:  .  0.9 %  sodium chloride infusion, , Intravenous, PRN, Osei-Bonsu, George, MD, Last Rate: 100 mL/hr at 04/23/18 1548 .  amLODipine (NORVASC) tablet 5 mg, 5 mg, Oral, Daily, Osei-Bonsu, George, MD, 5 mg at 04/23/18 1529 .  insulin aspart (novoLOG) injection 0-9 Units, 0-9 Units, Subcutaneous, Q4H, Osei-Bonsu, George, MD .  loratadine (CLARITIN) tablet 10 mg, 10 mg, Oral, Daily, Osei-Bonsu, George, MD, 10 mg at 04/23/18 1529 .  multivitamin liquid, , Oral, Daily, Osei-Bonsu, George, MD .  ondansetron (ZOFRAN) tablet 4 mg, 4 mg, Oral, Q6H PRN **OR** ondansetron (ZOFRAN) injection 4 mg, 4 mg, Intravenous, Q6H PRN, Jackie Plum, MD .  pantoprazole (PROTONIX) 40 MG injection, , , ,  .  pantoprazole (PROTONIX) 80 mg in sodium chloride 0.9 % 250 mL (0.32 mg/mL) infusion, 8 mg/hr, Intravenous, Continuous, Osei-Bonsu, George, MD, Last Rate: 25 mL/hr at 04/23/18 1236, 8 mg/hr at 04/23/18 1236 .  [START ON 04/27/2018] pantoprazole (PROTONIX) injection 40 mg, 40 mg, Intravenous, Q12H, Osei-Bonsu, Greggory Stallion, MD Allergies  Allergen Reactions  . Aspirin     Ok with Lodine, Advil, Ibuprofen  . Patanol [Olopatadine Hcl] Swelling     Objective:     BP 120/69 (BP Location: Right Arm)   Pulse 95   Temp 98.5 F (36.9 C) (Oral)   Resp 18   Ht 5\' 4"  (1.626 m)   Wt 76.2 kg   SpO2 98%   BMI 28.84 kg/m   No acute distress  Heart regular rhythm no murmurs  Lungs clear  Abdomen: Bowel  sounds present, soft, nontender  Laboratory No components found for: D1    Assessment:     Upper GI bleed characterized by melena and coffee-ground emesis  Anemia      Plan:     We will plan EGD tomorrow. Needs to be on PPI therapy at this time. Needs to avoid nonsteroidal anti-inflammatory medications. Lab Results  Component Value Date   HGB 7.0 (L) 04/23/2018   HGB 7.7 (L) 04/23/2018   HGB 10.2 (L) 09/15/2012   HCT 22.7 (L) 04/23/2018   HCT 24.7 (L) 04/23/2018   HCT 30.6 (L) 09/15/2012   ALKPHOS 182 (H) 09/15/2012   ALKPHOS 196 (H) 09/14/2012   ALKPHOS 256 (H) 09/13/2012   AST 34 09/15/2012   AST 36 09/14/2012   AST 45 (H) 09/13/2012   ALT 34 09/15/2012   ALT 37 (H) 09/14/2012   ALT 49 (H) 09/13/2012

## 2018-04-23 NOTE — ED Notes (Signed)
Attempted IV to right hand, tol well, unsuccessful. ED PA informed

## 2018-04-23 NOTE — ED Notes (Signed)
[facility] arrived to transport pt to [facility] 

## 2018-04-23 NOTE — ED Notes (Signed)
Chaperoned EDP during rectal exam.

## 2018-04-23 NOTE — ED Triage Notes (Addendum)
Presents with dizziness that began yesterday morning upon waking, the dizziness is described as "off balance" feeling. She then threw up once, black coffee ground emesis. PT does have a history of a gastric ulcer and a repair of gastric ulcer.  PT states she has been very constipated as well. She endorses passing out in her kitchen yesterday, she says she was standing and all of a sudden felt her vision going out and then woke up laying on the floor. She denies pain. Pt began a new medication,  Jardiance,  this week.

## 2018-04-23 NOTE — ED Notes (Signed)
ED Provider at bedside. 

## 2018-04-23 NOTE — ED Notes (Addendum)
Instructed on NPO

## 2018-04-23 NOTE — H&P (Signed)
History and Physical    Rachel Banks ZOX:[id number] DOB: 1956/05/16 DOA: 04/23/2018  Referring MD/NP/PA:  PCP: Darrow Bussing, MD   Outpatient Specialists:  Patient coming from: home  Chief Complaint: vomiting  HPI: Rachel Banks is a 62 y.o. 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 She denies any syncopal episode. No abdominal pain, no fever or chills, no chest pain.   ED Course: emergency room patient was hemodynamically stable. Hemoglobin was 7.7 hematocrit of 24.7 with positive Hemoccult. Dr. Evette Cristal of GI was consulted, and he requested hospitalist admission with plans for GI to consult.  Review of Systems: As per HPI otherwise 10 point review of systems negative.    Past Medical History:  Diagnosis Date  . Elevated LFTs   . Endometriosis   . Hyperlipidemia   . Hypertension   . Insulin resistance   . Obesity   . Panic attacks   . PUD (peptic ulcer disease)   . Seasonal allergies     Past Surgical History:  Procedure Laterality Date  . ABDOMINAL HYSTERECTOMY    . APPENDECTOMY    . ESOPHAGOGASTRODUODENOSCOPY  09/14/2012   Procedure: ESOPHAGOGASTRODUODENOSCOPY (EGD);  Surgeon: Shirley Friar, MD;  Location: [facility] ENDOSCOPY;  Service: Endoscopy;  Laterality: N/A;     reports that she has been smoking cigarettes. She has been smoking about 0.50 packs per day. She does not have any smokeless tobacco history on file. She reports that she does not drink alcohol or use drugs.  Allergies  Allergen Reactions  . Aspirin     Ok with Lodine, Advil, Ibuprofen  . Patanol [Olopatadine Hcl] Swelling    History reviewed. No pertinent family history.  Prior to Admission medications   Medication  Sig Start Date End Date Taking? Authorizing Provider  acetaminophen (TYLENOL) 325 MG tablet Take 2 tablets (650 mg total) by mouth every 6 (six) hours as needed (or Fever >/= 101). 09/15/12   Ghimire, Werner Lean, MD  amLODipine (NORVASC) 5 MG tablet Take 5 mg by mouth daily.    [provider]  cetirizine (ZYRTEC) 10 MG tablet Take 10 mg by mouth daily.    [provider]  hydrochlorothiazide (HYDRODIURIL) 25 MG tablet Take 25 mg by mouth daily.    [provider]  Multiple Vitamins-Minerals (MULTIVITAMIN PO) Take 1 tablet by mouth daily.    [provider]  pantoprazole (PROTONIX) 40 MG tablet Take 1 tablet (40 mg total) by mouth daily. 09/15/12   Maretta Bees, MD    Physical Exam: Vitals:   04/23/18 1212 04/23/18 1230 04/23/18 1300 04/23/18 1330  BP:  (!) 105/52 (!) 107/55 123/67  Pulse: 85 82 81 87  Resp: 16 (!) 21 19 16   Temp:      TempSrc:      SpO2: 100% 100% 100% 99%  Weight:      Height:          Constitutional: NAD, calm, comfortable Vitals:   04/23/18 1212 04/23/18 1230 04/23/18 1300 04/23/18 1330  BP:  (!) 105/52 (!) 107/55 123/67  Pulse: 85 82 81 87  Resp: 16 (!) 21 19 16   Temp:      TempSrc:      SpO2: 100% 100% 100% 99%  Weight:  Height:       Eyes: PERRL, (+)scleral pallor  ENMT: Mucous membranes are moist. Posterior pharynx clear of any exudate or lesions.Normal dentition.  Neck: normal, supple, no masses, no thyromegaly Respiratory: clear to auscultation bilaterally, no wheezing, no crackles. Normal respiratory effort. No accessory muscle use.  Cardiovascular: Regular rate and rhythm, no murmurs / rubs / gallops. No extremity edema. 2+ pedal pulses. No carotid bruits.  Abdomen: no tenderness, no masses palpated. No hepatosplenomegaly. Bowel sounds positive.  Musculoskeletal: no clubbing / cyanosis. No joint deformity upper and lower extremities. Good ROM, no contractures. Normal muscle tone.  Skin: no rashes,  lesions, ulcers. No induration Neurologic: CN 2-12 grossly intact. Sensation intact, DTR normal. Strength 5/5 in all 4.  Psychiatric: Normal judgment and insight. Alert and oriented x 3. Normal mood.     Labs on Admission: I have personally reviewed following labs and imaging studies  CBC: Recent Labs  Lab 04/23/18 1126  WBC 20.0*  HGB 7.7*  HCT 24.7*  MCV 89.2  PLT 192   Basic Metabolic Panel: Recent Labs  Lab 04/23/18 1126  NA 137  K 3.7  CL 99  CO2 27  GLUCOSE 279*  BUN 36*  CREATININE 0.73  CALCIUM 9.3   GFR: Estimated Creatinine Clearance: 72.9 mL/min (by C-G formula based on SCr of 0.73 mg/dL). Liver Function Tests: No results for input(s): AST, ALT, ALKPHOS, BILITOT, PROT, ALBUMIN in the last 168 hours. No results for input(s): LIPASE, AMYLASE in the last 168 hours. No results for input(s): AMMONIA in the last 168 hours. Coagulation Profile: Recent Labs  Lab 04/23/18 1126  INR 1.03   Cardiac Enzymes: No results for input(s): CKTOTAL, CKMB, CKMBINDEX, TROPONINI in the last 168 hours. BNP (last 3 results) No results for input(s): PROBNP in the last 8760 hours. HbA1C: No results for input(s): HGBA1C in the last 72 hours. CBG: Recent Labs  Lab 04/23/18 1128  GLUCAP 269*   Lipid Profile: No results for input(s): CHOL, HDL, LDLCALC, TRIG, CHOLHDL, LDLDIRECT in the last 72 hours. Thyroid Function Tests: No results for input(s): TSH, T4TOTAL, FREET4, T3FREE, THYROIDAB in the last 72 hours. Anemia Panel: No results for input(s): VITAMINB12, FOLATE, FERRITIN, TIBC, IRON, RETICCTPCT in the last 72 hours. Urine analysis:    Component Value Date/Time   COLORURINE YELLOW 04/23/2018 1126   APPEARANCEUR CLEAR 04/23/2018 1126   LABSPEC <1.005 (L) 04/23/2018 1126   PHURINE 6.0 04/23/2018 1126   GLUCOSEU >=500 (A) 04/23/2018 1126   HGBUR SMALL (A) 04/23/2018 1126   BILIRUBINUR NEGATIVE 04/23/2018 1126   KETONESUR NEGATIVE 04/23/2018 1126   PROTEINUR  NEGATIVE 04/23/2018 1126   NITRITE NEGATIVE 04/23/2018 1126   LEUKOCYTESUR NEGATIVE 04/23/2018 1126   Sepsis Labs: @LABRCNTIP (procalcitonin:4,lacticidven:4) )No results found for this or any previous visit (from the past 240 hour(s)).   Radiological Exams on Admission: No results found.  EKG: Independently reviewed.   Assessment/Plan Principal Problem:   Upper GI bleed Active Problems:   Hypertension   Hyperglycemia   1. Acute GI bleeding: History of gastric dieulafoy lesion Monitor H&H Hemodynamic monitoring Hold antihypertensive medication for now PRBC transfusion as needed IV PPI GI consulted  2. Acute blood loss anemia: 100 H&H PRBC transfusion as needed  3. Hyperglycemia: Patient gives history of prediabetes, denies history of diabetes however mentioned that she was recently started on a medication which sounds like ? "tradgenta". Check A1c Sliding-scale insulin for now Consider oral antibiotic medication as needed  DVT prophylaxis:SCD's Code Status: (Full Family Communication:  patient daughter at bedside Disposition Plan: home Consults called: due to GI, Dr. Evette Cristal per EDP Admission status: (inpatient / tele    Jackie Plum MD [facility] Pager [phone]  If 7PM-7AM, please contact night-coverage [email or web address] Password [email or web address]  04/23/2018, 3:05 PM

## 2018-04-23 NOTE — ED Provider Notes (Signed)
[facility] EMERGENCY DEPARTMENT Provider Note   CSN: [id number] Arrival date & time: 04/23/18  1058     History   Chief Complaint Chief Complaint  Patient presents with  . Dizziness    HPI Rachel Banks is a 62 y.o. female.  Patient with h/o upper GI bleeding 2/2 gastric dieulafoy lesion noted on upper endoscopy 5.5 years ago -- presents with c/o of worsening weakness, shortness of breath, fatigue.  Patient developed melena and had an episode of coffee-ground emesis yesterday.  She also had an episode where she syncopized while standing.  She denies abdominal pain or epigastric pain.  No fevers, chest pain or cough.  Symptoms are very similar to when patient had acute anemia from upper GI bleeding in 2014.  No treatments prior to arrival.  Patient denies heavy alcohol use.  No use of blood thinners.  She does take iron supplements. The onset of this condition was acute. The course is worsening. Aggravating factors: none. Alleviating factors: none.       Past Medical History:  Diagnosis Date  . Elevated LFTs   . Endometriosis   . Hyperlipidemia   . Hypertension   . Insulin resistance   . Obesity   . Panic attacks   . PUD (peptic ulcer disease)   . Seasonal allergies     Patient Active Problem List   Diagnosis Date Noted  . GI bleed 09/14/2012  . Acute blood loss anemia 09/14/2012  . Elevated transaminase level 09/14/2012  . Melena 09/14/2012    Past Surgical History:  Procedure Laterality Date  . ABDOMINAL HYSTERECTOMY    . APPENDECTOMY    . ESOPHAGOGASTRODUODENOSCOPY  09/14/2012   Procedure: ESOPHAGOGASTRODUODENOSCOPY (EGD);  Surgeon: Shirley Friar, MD;  Location: [facility] ENDOSCOPY;  Service: Endoscopy;  Laterality: N/A;     OB History   None      Home Medications    Prior to Admission medications   Medication Sig Start Date End Date Taking? Authorizing Provider  acetaminophen (TYLENOL) 325 MG tablet Take 2 tablets (650 mg total) by mouth  every 6 (six) hours as needed (or Fever >/= 101). 09/15/12   Ghimire, Werner Lean, MD  amLODipine (NORVASC) 5 MG tablet Take 5 mg by mouth daily.    [provider]  cetirizine (ZYRTEC) 10 MG tablet Take 10 mg by mouth daily.    [provider]  hydrochlorothiazide (HYDRODIURIL) 25 MG tablet Take 25 mg by mouth daily.    [provider]  Multiple Vitamins-Minerals (MULTIVITAMIN PO) Take 1 tablet by mouth daily.    [provider]  pantoprazole (PROTONIX) 40 MG tablet Take 1 tablet (40 mg total) by mouth daily. 09/15/12   Ghimire, Werner Lean, MD    Family History History reviewed. No pertinent family history.  Social History Social History   Tobacco Use  . Smoking status: Current Every Day Smoker    Packs/day: 0.50    Types: Cigarettes  Substance Use Topics  . Alcohol use: No  . Drug use: No     Allergies   Aspirin and Patanol [olopatadine hcl]   Review of Systems Review of Systems  Constitutional: Positive for fatigue. Negative for fever.  HENT: Negative for rhinorrhea and sore throat.   Eyes: Negative for redness.  Respiratory: Positive for shortness of breath (decreased exercise tolerance with exertion only). Negative for cough.   Cardiovascular: Negative for chest pain.  Gastrointestinal: Positive for blood in stool (melena), nausea and vomiting. Negative for abdominal pain  and diarrhea.  Genitourinary: Negative for dysuria.  Musculoskeletal: Negative for myalgias.  Skin: Negative for rash.  Neurological: Positive for syncope. Negative for headaches.     Physical Exam Updated Vital Signs BP (!) 140/52 (BP Location: Right Arm)   Pulse 96   Temp 98.7 F (37.1 C) (Oral)   Resp 18   Ht 5\' 4"  (1.626 m)   Wt 76.2 kg   SpO2 98%   BMI 28.84 kg/m   Physical Exam  Constitutional: She appears well-developed and well-nourished.  HENT:  Head: Normocephalic and atraumatic.  Eyes: Right eye exhibits no discharge. Left eye exhibits no  discharge.  Very pale conjunctiva.  Neck: Normal range of motion. Neck supple.  Cardiovascular: Normal rate, regular rhythm and normal heart sounds.  Mild tachycardia.  Pulmonary/Chest: Effort normal and breath sounds normal.  Abdominal: Soft. There is no tenderness. There is no rebound and no guarding.  No abdominal tenderness, rebound or guarding.  Genitourinary:  Genitourinary Comments: Positive Hemoccult, melena noted on exam.  Neurological: She is alert.  Skin: Skin is warm and dry.  Psychiatric: She has a normal mood and affect.  Nursing note and vitals reviewed.    ED Treatments / Results  Labs (all labs ordered are listed, but only abnormal results are displayed) Labs Reviewed  BASIC METABOLIC PANEL - Abnormal; Notable for the following components:      Result Value   Glucose, Bld 279 (*)    BUN 36 (*)    All other components within normal limits  CBC - Abnormal; Notable for the following components:   WBC 20.0 (*)    RBC 2.77 (*)    Hemoglobin 7.7 (*)    HCT 24.7 (*)    All other components within normal limits  URINALYSIS, ROUTINE W REFLEX MICROSCOPIC - Abnormal; Notable for the following components:   Specific Gravity, Urine <1.005 (*)    Glucose, UA >=500 (*)    Hgb urine dipstick SMALL (*)    All other components within normal limits  OCCULT BLOOD X 1 CARD TO LAB, STOOL - Abnormal; Notable for the following components:   Fecal Occult Bld POSITIVE (*)    All other components within normal limits  URINALYSIS, MICROSCOPIC (REFLEX) - Abnormal; Notable for the following components:   Bacteria, UA MANY (*)    All other components within normal limits  CBG MONITORING, ED - Abnormal; Notable for the following components:   Glucose-Capillary 269 (*)    All other components within normal limits  PROTIME-INR    EKG None  ED ECG REPORT   Date: 04/23/2018  Rate: 97  Rhythm: normal sinus rhythm  QRS Axis: normal  Intervals: normal  ST/T Wave abnormalities:  normal  Conduction Disutrbances:none  Narrative Interpretation: no QT prolong  Old EKG Reviewed: none available  I have personally reviewed the EKG tracing and agree with the computerized printout as noted.  Procedures Procedures (including critical care time)  Medications Ordered in ED Medications - No data to display   Initial Impression / Assessment and Plan / ED Course  I have reviewed the triage vital signs and the nursing notes.  Pertinent labs & imaging results that were available during my care of the patient were reviewed by me and considered in my medical decision making (see chart for details).     Patient seen and examined. Work-up initiated. Medications ordered.   Vital signs reviewed and are as follows: BP (!) 140/52 (BP Location: Right Arm)   Pulse 96  Temp 98.7 F (37.1 C) (Oral)   Resp 18   Ht 5\' 4"  (1.626 m)   Wt 76.2 kg   SpO2 98%   BMI 28.84 kg/m   12:30 PM Spoke with Dr. Evette Cristal of [facility], aware of patient. They will consult. Requests transfer to [facility].   12:55 PM Spoke with Dr. Julio Sicks who requests tele bed at [facility].  CRITICAL CARE Performed by: Renne Crigler PA-C Total critical care time: 40 minutes Critical care time was exclusive of separately billable procedures and treating other patients. Critical care was necessary to treat or prevent imminent or life-threatening deterioration. Critical care was time spent personally by me on the following activities: development of treatment plan with patient and/or surrogate as well as nursing, discussions with consultants, evaluation of patient's response to treatment, examination of patient, obtaining history from patient or surrogate, ordering and performing treatments and interventions, ordering and review of laboratory studies, ordering and review of radiographic studies, pulse oximetry and re-evaluation of patient's condition.    Final Clinical Impressions(s) / ED Diagnoses   Final  diagnoses:  Upper GI bleed   Admit.  ED Discharge Orders    None       Renne Crigler, PA-C 04/23/18 1256    Sabas Sous, MD 04/23/18 [phone]

## 2018-04-23 NOTE — ED Notes (Signed)
Pt on cardiac monitor and auto VS 

## 2018-04-23 NOTE — ED Notes (Signed)
Pt ambulatory to bathroom with steady gate. Denies dizziness. No SOB noted.

## 2018-04-23 NOTE — ED Notes (Signed)
Pt states she ate corn flakes and milk around 0900.

## 2018-04-24 ENCOUNTER — Inpatient Hospital Stay (HOSPITAL_COMMUNITY): Payer: BLUE CROSS/BLUE SHIELD | Admitting: Anesthesiology

## 2018-04-24 ENCOUNTER — Encounter (HOSPITAL_COMMUNITY)
Admission: EM | Disposition: A | Discharge status: Home / Self Care | Payer: Self-pay | Source: Home / Self Care | Attending: Family Medicine

## 2018-04-24 ENCOUNTER — Encounter (HOSPITAL_COMMUNITY): Payer: Self-pay | Admitting: *Deleted

## 2018-04-24 DIAGNOSIS — R739 Hyperglycemia, unspecified: Secondary | ICD-10-CM

## 2018-04-24 DIAGNOSIS — I10 Essential (primary) hypertension: Secondary | ICD-10-CM

## 2018-04-24 DIAGNOSIS — K922 Gastrointestinal hemorrhage, unspecified: Secondary | ICD-10-CM

## 2018-04-24 HISTORY — PX: ESOPHAGOGASTRODUODENOSCOPY (EGD) WITH PROPOFOL: SHX5813

## 2018-04-24 HISTORY — PX: HOT HEMOSTASIS: SHX5433

## 2018-04-24 LAB — HEMOGLOBIN AND HEMATOCRIT, BLOOD
HCT: 25.2 % — ABNORMAL LOW (ref 36.0–46.0)
Hemoglobin: 8.2 g/dL — ABNORMAL LOW (ref 12.0–15.0)

## 2018-04-24 LAB — COMPREHENSIVE METABOLIC PANEL
ALK PHOS: 73 U/L (ref 38–126)
ALT: 42 U/L (ref 0–44)
ANION GAP: 9 (ref 5–15)
AST: 63 U/L — ABNORMAL HIGH (ref 15–41)
Albumin: 3 g/dL — ABNORMAL LOW (ref 3.5–5.0)
BILIRUBIN TOTAL: 1.1 mg/dL (ref 0.3–1.2)
BUN: 11 mg/dL (ref 8–23)
CALCIUM: 8.3 mg/dL — AB (ref 8.9–10.3)
CO2: 23 mmol/L (ref 22–32)
Chloride: 108 mmol/L (ref 98–111)
Creatinine, Ser: 0.63 mg/dL (ref 0.44–1.00)
GFR calc Af Amer: >60 mL/min (ref >60)
Glucose, Bld: 118 mg/dL — ABNORMAL HIGH (ref 70–99)
POTASSIUM: 4.5 mmol/L (ref 3.5–5.1)
Sodium: 140 mmol/L (ref 135–145)
TOTAL PROTEIN: 6 g/dL — AB (ref 6.5–8.1)

## 2018-04-24 LAB — CBC
HCT: 28.1 % — ABNORMAL LOW (ref 36.0–46.0)
HCT: 28.5 % — ABNORMAL LOW (ref 36.0–46.0)
HEMOGLOBIN: 9 g/dL — AB (ref 12.0–15.0)
HEMOGLOBIN: 9.6 g/dL — AB (ref 12.0–15.0)
MCH: 28.4 pg (ref 26.0–34.0)
MCH: 29.6 pg (ref 26.0–34.0)
MCHC: 32 g/dL (ref 30.0–36.0)
MCHC: 33.7 g/dL (ref 30.0–36.0)
MCV: 88.6 fL (ref 78.0–100.0)
MCV: 88 fL (ref 78.0–100.0)
Platelets: 73 10*3/uL — ABNORMAL LOW (ref 150–400)
Platelets: 115 10*3/uL — ABNORMAL LOW (ref 150–400)
RBC: 3.17 MIL/uL — ABNORMAL LOW (ref 3.87–5.11)
RBC: 3.24 MIL/uL — ABNORMAL LOW (ref 3.87–5.11)
RDW: 15.6 % — AB (ref 11.5–15.5)
RDW: 16 % — AB (ref 11.5–15.5)
WBC: 9.3 10*3/uL (ref 4.0–10.5)
WBC: 10.2 10*3/uL (ref 4.0–10.5)

## 2018-04-24 LAB — GLUCOSE, CAPILLARY
GLUCOSE-CAPILLARY: 100 mg/dL — AB (ref 70–99)
GLUCOSE-CAPILLARY: 113 mg/dL — AB (ref 70–99)
GLUCOSE-CAPILLARY: 124 mg/dL — AB (ref 70–99)
GLUCOSE-CAPILLARY: 127 mg/dL — AB (ref 70–99)
Glucose-Capillary: 140 mg/dL — ABNORMAL HIGH (ref 70–99)

## 2018-04-24 LAB — APTT: APTT: 20 s — AB (ref 24–36)

## 2018-04-24 LAB — PREPARE RBC (CROSSMATCH)

## 2018-04-24 LAB — PROTIME-INR
INR: 1.14
PROTHROMBIN TIME: 14.5 s (ref 11.4–15.2)

## 2018-04-24 LAB — HIV ANTIBODY (ROUTINE TESTING W REFLEX): HIV Screen 4th Generation wRfx: NONREACTIVE

## 2018-04-24 SURGERY — ESOPHAGOGASTRODUODENOSCOPY (EGD) WITH PROPOFOL
Anesthesia: Monitor Anesthesia Care

## 2018-04-24 MED ORDER — PROPOFOL 10 MG/ML IV BOLUS
INTRAVENOUS | Status: DC | PRN
  Administered 2018-04-24 (×4): 20 mg via INTRAVENOUS

## 2018-04-24 MED ORDER — ONDANSETRON HCL 4 MG/2ML IJ SOLN: 4.0000 mg | Freq: Once | INTRAMUSCULAR | Status: DC | PRN

## 2018-04-24 MED ORDER — BUTAMBEN-TETRACAINE-BENZOCAINE 2-2-14 % EX AERO
INHALATION_SPRAY | CUTANEOUS | Status: DC | PRN
  Administered 2018-04-24: 1 via TOPICAL

## 2018-04-24 MED ORDER — PHENYLEPHRINE 40 MCG/ML (10ML) SYRINGE FOR IV PUSH (FOR BLOOD PRESSURE SUPPORT)
PREFILLED_SYRINGE | INTRAVENOUS | Status: DC | PRN
  Administered 2018-04-24: 80 ug via INTRAVENOUS

## 2018-04-24 MED ORDER — PROPOFOL 500 MG/50ML IV EMUL
INTRAVENOUS | Status: DC | PRN
  Administered 2018-04-24: 100 ug/kg/min via INTRAVENOUS

## 2018-04-24 MED ORDER — LACTATED RINGERS IV SOLN
INTRAVENOUS | Status: DC | PRN
  Administered 2018-04-24: 13:00:00 via INTRAVENOUS

## 2018-04-24 MED ORDER — SODIUM CHLORIDE 0.9 % IV SOLN: INTRAVENOUS | Status: DC

## 2018-04-24 SURGICAL SUPPLY — 14 items

## 2018-04-24 NOTE — Progress Notes (Signed)
PROGRESS NOTE  Rachel Banks  ZOX:[id number] DOB: Jun 29, 1956 DOA: 04/23/2018 PCP: Darrow Bussing, MD   Brief Narrative: Rachel Banks is a 62 y.o. female with a history of gastric dieulafoy lesion, HTN, HLD who presented with 2 days of vomiting w/coffee ground emesis and melena associated with fatigue, generalized weakness. Hgb on arrival was 7.7, +FOBT. GI recommended admission. Hgb trended downward to 6.5, so the patient was given 2u PRBCs with improvement in symptoms, hgb up to 9 and no further evidence of active bleeding. EGD 9/9 revealed chronic gastritis with minimal hemorrhage, hiatal hernia, and "?Dieulafoy lesion of stomach. 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  Assessment & Plan: Principal Problem:   Upper GI bleed Active Problems:   Hypertension   Hyperglycemia  Acute upper GI bleeding: Most likely due to gastritis, possibly gastropathy seen on EGD 9/9.  - Continue IV PPI, ADAT per GI.  - Avoid NSAIDs  Acute blood loss anemia:  - Trend blood counts  T2DM with hyperglycemia: HbA1c 7.4%.  - SSI - Needs outpatient follow up  Thrombocytopenia: Unclear cause, acutely down this PM following transfusions, as is the WBC count. No exposures to new drugs (e.g. AEDs) or viral illness coming on. Not started on heparin. ?With hepatic nodularity and consideration of cirrhosis (though no splenomegaly) if this is her true platelet count and she had some hemoconcentration initially also causing a leukocytosis without fever which has resolved without antimicrobials.  - Verified with RN that pt is having no bleeding, stool color is now back to normal. No rashes/purpura/ecchymoses. Urged that any of these findings should prompt a page to MD.  - Repeat CBC for verification.   Elevated LFTs, liver nodularity on CT Oct 2018: AST 63, albumin low at 3, INR 1.14. Na normal. No ascites. - Cirrhosis screening labs ordered - GI follow up as outpatient.    Leukocytosis: Resolved without Tx. Bacteria noted in UA but pt has no urinary symptoms and no pyuria.  - Monitor off abx.   HTN:  - Norvasc  DVT prophylaxis: SCDs Code Status: Full Family Communication: None at bedside Disposition Plan: Home once stable  Consultants:   GI  Procedures:   EGD 04/24/2018 Dr. Ewing Schlein: Impression:       - Small hiatal hernia.                           - Chronic gastritis with minimal hemorrhage.                           -? Dieulafoy lesion of stomach. 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                           -  The examination was otherwise normal.                           - Normal larynx.                           - No specimens collected. Recommendation: Patient has a contact number available for                            emergencies. The signs and symptoms of potential                            delayed complications were discussed with the                            patient. Return to normal activities tomorrow.                            Written discharge instructions were provided to the                            patient.                           - Clear liquid diet today. may have soft solids                            tomorrow if no further bleeding                           - Continue present medications. may change to oral                            pump inhibitors tomorrow                           - Return to GI clinic in 4 weeks. no aspirin or                            nonsteroidals long-term                           - Telephone GI clinic if symptomatic PRN. her CT                            last year implied cirrhosis await liver chest and                            albumin scheduled to be ordered  tomorrow and                            consider further workup as an outpatient if needed  Antimicrobials:  None  Subjective: No further vomiting, bleeding noted. Feels less weak after blood.   Objective: Vitals:   04/24/18 1324 04/24/18 1356 04/24/18 1405 04/24/18  1707  BP:  (!) 111/53 (!) 116/55 (!) 141/81  Pulse: 94  79 81  Resp: (!) 100 20 (!) 21 18  Temp: 98.9 F (37.2 C) 98.5 F (36.9 C)  97.9 F (36.6 C)  TempSrc: Oral Oral  Oral  SpO2: 100% 100% 100% 100%  Weight:      Height:        Intake/Output Summary (Last 24 hours) at 04/24/2018 2000 Last data filed at 04/24/2018 1520 Gross per 24 hour  Intake 1082.67 ml  Output 0 ml  Net 1082.67 ml   Filed Weights   04/23/18 1104 04/23/18 2034  Weight: 76.2 kg 76.3 kg    Gen: 62 y.o. female in no distress  Pulm: Non-labored breathing. Clear to auscultation bilaterally.  CV: Regular rate and rhythm. No murmur, rub, or gallop.  No pedal edema. GI: Abdomen soft, non-tender, non-distended, with normoactive bowel sounds. No organomegaly or masses felt. Ext: Warm, no deformities Skin: No rashes, lesions no ulcers Neuro: Alert and oriented. No focal neurological deficits. Psych: Judgement and insight appear normal. Mood & affect appropriate.   Data Reviewed: I have personally reviewed following labs and imaging studies  CBC: Recent Labs  Lab 04/23/18 1126 04/23/18 1524 04/23/18 2104 04/24/18 0930 04/24/18 1619  WBC 20.0* 16.7*  --   --  9.3  HGB 7.7* 7.0* 6.5* 8.2* 9.0*  HCT 24.7* 22.7* 20.8* 25.2* 28.1*  MCV 89.2 90.1  --   --  88.6  PLT 192 155  --   --  73*   Basic Metabolic Panel: Recent Labs  Lab 04/23/18 1126 04/24/18 1512  NA 137 140  K 3.7 4.5  CL 99 108  CO2 27 23  GLUCOSE 279* 118*  BUN 36* 11  CREATININE 0.73 0.63  CALCIUM 9.3 8.3*   GFR: Estimated Creatinine Clearance: 72.9 mL/min (by C-G formula based on SCr of 0.63 mg/dL). Liver Function Tests: Recent Labs  Lab 04/24/18 1512  AST  63*  ALT 42  ALKPHOS 73  BILITOT 1.1  PROT 6.0*  ALBUMIN 3.0*   No results for input(s): LIPASE, AMYLASE in the last 168 hours. No results for input(s): AMMONIA in the last 168 hours. Coagulation Profile: Recent Labs  Lab 04/23/18 1126 04/24/18 1512  INR 1.03 1.14   Cardiac Enzymes: No results for input(s): CKTOTAL, CKMB, CKMBINDEX, TROPONINI in the last 168 hours. BNP (last 3 results) No results for input(s): PROBNP in the last 8760 hours. HbA1C: Recent Labs    04/23/18 1524  HGBA1C 7.4*   CBG: Recent Labs  Lab 04/23/18 2034 04/24/18 0007 04/24/18 0414 04/24/18 0742 04/24/18 1647  GLUCAP 155* 100* 124* 127* 140*   Lipid Profile: No results for input(s): CHOL, HDL, LDLCALC, TRIG, CHOLHDL, LDLDIRECT in the last 72 hours. Thyroid Function Tests: Recent Labs    04/23/18 1524  TSH 3.224   Anemia Panel: No results for input(s): VITAMINB12, FOLATE, FERRITIN, TIBC, IRON, RETICCTPCT in the last 72 hours. Urine analysis:    Component Value Date/Time   COLORURINE YELLOW 04/23/2018 1126   APPEARANCEUR CLEAR 04/23/2018 1126   LABSPEC <1.005 (L) 04/23/2018 1126   PHURINE 6.0 04/23/2018 1126   GLUCOSEU >=500 (A) 04/23/2018 1126   HGBUR SMALL (A) 04/23/2018 1126   BILIRUBINUR NEGATIVE 04/23/2018 1126   KETONESUR NEGATIVE 04/23/2018 1126   PROTEINUR NEGATIVE 04/23/2018 1126   NITRITE NEGATIVE 04/23/2018 1126   LEUKOCYTESUR NEGATIVE 04/23/2018 1126   No results found for this or any previous visit (from the past 240 hour(s)).  Radiology Studies: No results found.  Scheduled Meds: . amLODipine  5 mg Oral Daily  . insulin aspart  0-9 Units Subcutaneous Q4H  . loratadine  10 mg Oral Daily  . multivitamin   Oral Daily  . [START ON 04/27/2018] pantoprazole  40 mg Intravenous Q12H   Continuous Infusions: . sodium chloride 100 mL/hr at 04/24/18 0216     LOS: 1 day   Time spent: 25 minutes.  Tyrone Nine, MD [facility] [email or web address] Password  [email or web address] 04/24/2018, 8:00 PM

## 2018-04-24 NOTE — Progress Notes (Signed)
Rachel Banks 12:48 PM  Subjective: Patient seen and examined and case discussed with her husband and her hospital computer chart reviewed and she thinks her bleeding has stopped and she does use some nonsteroidals periodically and she did have a colonoscopy in the spring and she did throw up black material as well as have black bowel movements and she has no other complaints  Objective: Vital signs stable afebrile no acute distress exam please see preassessment evaluationlabs reviewed currently getting transfused  Assessment: Upper GI bleeding  Plan: Okay to proceed with endoscopy with anesthesia assistance today  [facility] E  Pager [phone] After 5PM or if no answer call [phone]

## 2018-04-24 NOTE — Anesthesia Preprocedure Evaluation (Addendum)
Anesthesia Evaluation  Patient identified by MRN, date of birth, ID band Patient awake    Reviewed: Allergy & Precautions, NPO status , Patient's Chart, lab work & pertinent test results  History of Anesthesia Complications Negative for: history of anesthetic complications  Airway Mallampati: III  TM Distance: >3 FB Neck ROM: Full    Dental no notable dental hx. (+) Teeth Intact, Dental Advisory Given   Pulmonary Current Smoker,    Pulmonary exam normal        Cardiovascular hypertension, Pt. on medications Normal cardiovascular exam     Neuro/Psych negative neurological ROS     GI/Hepatic Neg liver ROS, PUD,   Endo/Other  negative endocrine ROS  Renal/GU negative Renal ROS     Musculoskeletal negative musculoskeletal ROS (+)   Abdominal   Peds  Hematology  (+) anemia ,   Anesthesia Other Findings   Reproductive/Obstetrics                            Anesthesia Physical Anesthesia Plan  ASA: II  Anesthesia Plan: MAC   Post-op Pain Management:    Induction:   PONV Risk Score and Plan: 1 and Propofol infusion  Airway Management Planned: Nasal Cannula  Additional Equipment:   Intra-op Plan:   Post-operative Plan:   Informed Consent: I have reviewed the patients History and Physical, chart, labs and discussed the procedure including the risks, benefits and alternatives for the proposed anesthesia with the patient or authorized representative who has indicated his/her understanding and acceptance.   Dental advisory given  Plan Discussed with: Anesthesiologist, CRNA and Surgeon  Anesthesia Plan Comments:        Anesthesia Quick Evaluation

## 2018-04-24 NOTE — Anesthesia Postprocedure Evaluation (Signed)
Anesthesia Post Note  Patient: Rachel Banks  Procedure(s) Performed: ESOPHAGOGASTRODUODENOSCOPY (EGD) WITH PROPOFOL (N/A ) HOT HEMOSTASIS (ARGON PLASMA COAGULATION/BICAP) (N/A )     Patient location during evaluation: PACU Anesthesia Type: MAC Level of consciousness: awake and alert Pain management: pain level controlled Vital Signs Assessment: post-procedure vital signs reviewed and stable Respiratory status: spontaneous breathing, nonlabored ventilation, respiratory function stable and patient connected to nasal cannula oxygen Cardiovascular status: stable and blood pressure returned to baseline Postop Assessment: no apparent nausea or vomiting Anesthetic complications: no    Last Vitals:  Vitals:   04/24/18 1405 04/24/18 1707  BP: (!) 116/55 (!) 141/81  Pulse: 79 81  Resp: (!) 21 18  Temp:  36.6 C  SpO2: 100% 100%    Last Pain:  Vitals:   04/24/18 1707  TempSrc: Oral  PainSc:                  Lidia Collum

## 2018-04-24 NOTE — Anesthesia Procedure Notes (Signed)
Procedure Name: MAC Date/Time: 04/24/2018 1:32 PM Performed by: Carney Living, CRNA Pre-anesthesia Checklist: Patient identified, Emergency Drugs available, Suction available, Patient being monitored and Timeout performed Patient Re-evaluated:Patient Re-evaluated prior to induction Oxygen Delivery Method: Nasal cannula Preoxygenation: Pre-oxygenation with 100% oxygen

## 2018-04-24 NOTE — Op Note (Signed)
[facility] Patient Name: Rachel Banks Procedure Date : 04/24/2018 MRN: [id number] Attending MD: Vida Rigger , MD Date of Birth: 1956-05-06 CSN: [id number] Age: 62 Admit Type: Inpatient Procedure:                Upper GI endoscopy Indications:              Coffee-ground emesis, Melena Providers:                Vida Rigger, MD, Norman Clay, RN, Arlee Muslim                            Tech., Technician, Kirt Boys, CRNA Referring MD:              Medicines:                Propofol total dose 100 mg IV Complications:            No immediate complications. Estimated Blood Loss:     Estimated blood loss: none. Procedure:                Pre-Anesthesia Assessment:                           - Prior to the procedure, a History and Physical                            was performed, and patient medications and                            allergies were reviewed. The patient's tolerance of                            previous anesthesia was also reviewed. The risks                            and benefits of the procedure and the sedation                            options and risks were discussed with the patient.                            All questions were answered, and informed consent                            was obtained. Prior Anticoagulants: The patient has                            taken no previous anticoagulant or antiplatelet                            agents. ASA Grade Assessment: II - A patient with                            mild systemic disease. After reviewing the risks  and benefits, the patient was deemed in                            satisfactory condition to undergo the procedure.                           After obtaining informed consent, the endoscope was                            passed under direct vision. Throughout the                            procedure, the patient's blood pressure, pulse, and   oxygen saturations were monitored continuously. The                            [id number]-H190 ([id number]) Olympus adult EGD was introduced                            through the mouth, and advanced to the third part                            of duodenum. The upper GI endoscopy was                            accomplished without difficulty. The patient                            tolerated the procedure well. Scope In: Scope Out: Findings:      A small hiatal hernia was present.      Localized mild inflammation with very minimal hemorrhage characterized       by congestion (edema) and granularity was found in the cardia, in the       gastric fundus, in the gastric body, on the greater curvature of the       stomach and on the lesser curvature of the stomach. which actually       looked like mild portal gastropathy      A ?Dieulafoy lesion versus portal gastropathy with oozing bleeding and       was found on the greater curvature of the stomach. the heater probe was       used for cautery with excellent ablation of the lesion and no further       bleeding      The duodenal bulb, first portion of the duodenum, second portion of the       duodenum and third portion of the duodenum were normal.      The exam was otherwise without abnormality.      The larynx was normal. Impression:               - Small hiatal hernia.                           - Chronic gastritis with minimal hemorrhage.                           -? Dieulafoy lesion of  stomach. 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                           - The examination was otherwise normal.                           - Normal larynx.                           - No specimens collected. Recommendation:           - Patient  has a contact number available for                            emergencies. The signs and symptoms of potential                            delayed complications were discussed with the                            patient. Return to normal activities tomorrow.                            Written discharge instructions were provided to the                            patient.                           - Clear liquid diet today. may have soft solids                            tomorrow if no further bleeding                           - Continue present medications. may change to oral                            pump inhibitors tomorrow                           - Return to GI clinic in 4 weeks. no aspirin or  nonsteroidals long-term                           - Telephone GI clinic if symptomatic PRN. her CT                            last year implied cirrhosis await liver chest and                            albumin scheduled to be ordered tomorrow and                            consider further workup as an outpatient if needed Procedure Code(s):        --- Professional ---                           [phone], Esophagogastroduodenoscopy, flexible,                            transoral; diagnostic, including collection of                            specimen(s) by brushing or washing, when performed                            (separate procedure) Diagnosis Code(s):        --- Professional ---                           K44.9, Diaphragmatic hernia without obstruction or                            gangrene                           K29.51, Unspecified chronic gastritis with bleeding                           K31.82, Dieulafoy lesion (hemorrhagic) of stomach                            and duodenum                           K92.0, Hematemesis                           K92.1, Melena (includes Hematochezia) CPT copyright 2017 [employer]. All rights reserved. The  codes documented in this report are preliminary and upon coder review may  be revised to meet current compliance requirements. Vida Rigger, MD 04/24/2018 2:06:31 PM This report has been signed electronically. Number of Addenda: 0

## 2018-04-24 NOTE — Transfer of Care (Signed)
Immediate Anesthesia Transfer of Care Note  Patient: Rachel Banks  Procedure(s) Performed: ESOPHAGOGASTRODUODENOSCOPY (EGD) WITH PROPOFOL (N/A )  Patient Location: Endoscopy Unit  Anesthesia Type:MAC  Level of Consciousness: awake, alert , oriented and patient cooperative  Airway & Oxygen Therapy: Patient Spontanous Breathing and Patient connected to nasal cannula oxygen  Post-op Assessment: Report given to RN, Post -op Vital signs reviewed and stable and Patient moving all extremities X 4  Post vital signs: Reviewed and stable  Last Vitals:  Vitals Value Taken Time  BP 111/53 04/24/2018  1:56 PM  Temp 36.9 C 04/24/2018  1:56 PM  Pulse 86 04/24/2018  1:59 PM  Resp 27 04/24/2018  1:59 PM  SpO2 100 % 04/24/2018  1:59 PM  Vitals shown include unvalidated device data.  Last Pain:  Vitals:   04/24/18 1356  TempSrc: Oral  PainSc: 0-No pain         Complications: No apparent anesthesia complications

## 2018-04-25 ENCOUNTER — Encounter (HOSPITAL_COMMUNITY): Payer: Self-pay

## 2018-04-25 LAB — BPAM RBC
Blood Product Expiration Date: 201909142359
Blood Product Expiration Date: 201909272359
ISSUE DATE / TIME: 201909090408
ISSUE DATE / TIME: 201909091108
UNIT TYPE AND RH: 7300
Unit Type and Rh: 1700

## 2018-04-25 LAB — TYPE AND SCREEN
ABO/RH(D): B POS
Antibody Screen: NEGATIVE
UNIT DIVISION: 0
UNIT DIVISION: 0

## 2018-04-25 LAB — CBC
HCT: 27.9 % — ABNORMAL LOW (ref 36.0–46.0)
Hemoglobin: 9.2 g/dL — ABNORMAL LOW (ref 12.0–15.0)
MCH: 28.8 pg (ref 26.0–34.0)
MCHC: 33 g/dL (ref 30.0–36.0)
MCV: 87.5 fL (ref 78.0–100.0)
PLATELETS: 117 10*3/uL — AB (ref 150–400)
RBC: 3.19 MIL/uL — AB (ref 3.87–5.11)
RDW: 16.2 % — AB (ref 11.5–15.5)
WBC: 8.5 10*3/uL (ref 4.0–10.5)

## 2018-04-25 LAB — GLUCOSE, CAPILLARY
GLUCOSE-CAPILLARY: 117 mg/dL — AB (ref 70–99)
GLUCOSE-CAPILLARY: 128 mg/dL — AB (ref 70–99)
Glucose-Capillary: 121 mg/dL — ABNORMAL HIGH (ref 70–99)
Glucose-Capillary: 110 mg/dL — ABNORMAL HIGH (ref 70–99)

## 2018-04-25 MED ORDER — PANTOPRAZOLE SODIUM 40 MG PO TBEC
40.0000 mg | DELAYED_RELEASE_TABLET | Freq: Two times a day (BID) | ORAL | Status: DC
  Administered 2018-04-25: 40 mg via ORAL
  Filled 2018-04-25: qty 1

## 2018-04-25 MED ORDER — PANTOPRAZOLE SODIUM 40 MG PO TBEC: 40.0000 mg | DELAYED_RELEASE_TABLET | Freq: Two times a day (BID) | ORAL | 0 refills | Status: AC

## 2018-04-25 NOTE — Progress Notes (Signed)
Rachel Banks to be D/C'd Home per MD order.  Discussed prescriptions and follow up appointments with the patient. Prescriptions given to patient, medication list explained in detail. Pt verbalized understanding.  Allergies as of 04/25/2018      Reactions   Aspirin Other (See Comments)   "GI problems" Ok with Lodine, Advil, Ibuprofen   Patanol [olopatadine Hcl] Swelling      Medication List    TAKE these medications   acetaminophen 325 MG tablet Commonly known as:  TYLENOL Take 2 tablets (650 mg total) by mouth every 6 (six) hours as needed (or Fever >/= 101).   cetirizine 10 MG tablet Commonly known as:  ZYRTEC Take 10 mg by mouth daily.   clobetasol ointment 0.05 % Commonly known as:  TEMOVATE Apply 1 application topically daily.   folic acid 1 MG tablet Commonly known as:  FOLVITE Take 1 mg by mouth daily.   hydrochlorothiazide 25 MG tablet Commonly known as:  HYDRODIURIL Take 25 mg by mouth daily.   JARDIANCE 10 MG Tabs tablet Generic drug:  empagliflozin Take 10 mg by mouth daily.   methotrexate 2.5 MG tablet Commonly known as:  RHEUMATREX Take 12.5 mg by mouth once a week. On Mondays   metoprolol succinate 25 MG 24 hr tablet Commonly known as:  TOPROL-XL Take 25 mg by mouth daily.   MULTIVITAMIN PO Take 1 tablet by mouth daily.   pantoprazole 40 MG tablet Commonly known as:  PROTONIX Take 1 tablet (40 mg total) by mouth 2 (two) times daily. What changed:  when to take this   rosuvastatin 10 MG tablet Commonly known as:  CRESTOR Take 10 mg by mouth daily.   sertraline 50 MG tablet Commonly known as:  ZOLOFT Take 50 mg by mouth daily.   ursodiol 500 MG tablet Commonly known as:  ACTIGALL Take 500 mg by mouth 2 (two) times daily.       Vitals:   04/25/18 0616 04/25/18 0957  BP: 118/68 133/77  Pulse: 80 72  Resp: 20 17  Temp: 98.6 F (37 C) (!) 97.5 F (36.4 C)  SpO2: 100% 100%    Skin clean, dry and intact without evidence of skin  break down, no evidence of skin tears noted. IV catheter discontinued intact. Site without signs and symptoms of complications. Dressing and pressure applied. Pt denies pain at this time. No complaints noted.  An After Visit Summary was printed and given to the patient. Patient escorted via WC, and D/C home via private auto.  Selina Cooley, RN

## 2018-04-25 NOTE — Progress Notes (Signed)
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

## 2018-04-25 NOTE — Discharge Summary (Signed)
Physician Discharge Summary  Rachel Banks:[id number] DOB: 1956/03/09 DOA: 04/23/2018  PCP: Darrow Bussing, MD  Admit date: 04/23/2018 Discharge date: 04/25/2018  Admitted From: Home Disposition: Home   Recommendations for Outpatient Follow-up:  1. Follow up with PCP in 1-2 weeks 2. Please obtain CBC in one week 3. Follow up with GI in 1 month for ongoing evaluation of gastritis and work up for possible hepatic cirrhosis.   Home Health: None Equipment/Devices: None Discharge Condition: Stable CODE STATUS: Full Diet recommendation: Heart healthy, carb-modified  Brief/Interim Summary: Rachel Banks is a 62 y.o. female with a history of gastric dieulafoy lesion, HTN, HLD who presented with 2 days of vomiting w/coffee ground emesis and melena associated with fatigue, generalized weakness. Hgb on arrival was 7.7, +FOBT. GI recommended admission. Hgb trended downward to 6.5, so the patient was given 2u PRBCs with improvement in symptoms, hgb up to 9 and no further evidence of active bleeding. EGD 9/9 revealed chronic gastritis with minimal hemorrhage, hiatal hernia, and "?Dieulafoy lesion of stomach. 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 Stools have normalized and blood counts stabilized, so GI confirms that she is stable for discharge with plans to follow up.  Discharge Diagnoses:  Principal Problem:   Upper GI bleed Active Problems:   Hypertension   Hyperglycemia  Acute upper GI bleeding: Most likely due to gastritis, possibly gastropathy seen on EGD 9/9.  - Continue PPI BID - Avoid NSAIDs. Stop smoking.  Acute blood loss anemia:  - Trend blood counts at discharge  T2DM with hyperglycemia: HbA1c 7.4%.  - Continue outpatient medications  Thrombocytopenia: Unclear cause. No exposures to new drugs (e.g. AEDs) or viral illness coming on. Not started on heparin. ?With hepatic nodularity and consideration of cirrhosis (though no  splenomegaly) if she had some hemoconcentration initially also causing a leukocytosis without fever which has resolved without antimicrobials.  - Stable on repeat checks and no bleeding. Follow up recommended.  Elevated LFTs, liver nodularity on CT Oct 2018: AST 63, albumin low at 3, INR 1.14. Na normal. No ascites. - Cirrhosis evaluation per GI as outpatient  Leukocytosis: Resolved without Tx. Bacteria noted in UA but pt has no urinary symptoms and no pyuria.  - Monitor off abx.   HTN:  - Norvasc  Discharge Instructions Discharge Instructions    Discharge instructions   Complete by:  As directed    You were admitted for GI bleeding that may have been due to gastritis or portal gastropathy. Fortunately you show no signs of bleeding currently and have stable blood counts following transfusions. GI has recommended that you be discharged and follow up in the next month.  - Make sure to avoid NSAIDs (ibuprofen, aleve, advil, naproxen, etc.) - Take protonix twice per day until you follow up with GI. A new prescription was sent to your pharmacy. - If you notice symptoms returning, seek medical attention right away.   Increase activity slowly   Complete by:  As directed      Allergies as of 04/25/2018      Reactions   Aspirin Other (See Comments)   "GI problems" Ok with Lodine, Advil, Ibuprofen   Patanol [olopatadine Hcl] Swelling      Medication List    TAKE these medications   acetaminophen 325 MG tablet Commonly known as:  TYLENOL Take 2 tablets (650 mg total) by mouth every 6 (six) hours as needed (or Fever >/= 101).   cetirizine 10 MG tablet Commonly  known as:  ZYRTEC Take 10 mg by mouth daily.   clobetasol ointment 0.05 % Commonly known as:  TEMOVATE Apply 1 application topically daily.   folic acid 1 MG tablet Commonly known as:  FOLVITE Take 1 mg by mouth daily.   hydrochlorothiazide 25 MG tablet Commonly known as:  HYDRODIURIL Take 25 mg by mouth daily.    JARDIANCE 10 MG Tabs tablet Generic drug:  empagliflozin Take 10 mg by mouth daily.   methotrexate 2.5 MG tablet Commonly known as:  RHEUMATREX Take 12.5 mg by mouth once a week. On Mondays   metoprolol succinate 25 MG 24 hr tablet Commonly known as:  TOPROL-XL Take 25 mg by mouth daily.   MULTIVITAMIN PO Take 1 tablet by mouth daily.   pantoprazole 40 MG tablet Commonly known as:  PROTONIX Take 1 tablet (40 mg total) by mouth 2 (two) times daily. What changed:  when to take this   rosuvastatin 10 MG tablet Commonly known as:  CRESTOR Take 10 mg by mouth daily.   sertraline 50 MG tablet Commonly known as:  ZOLOFT Take 50 mg by mouth daily.   ursodiol 500 MG tablet Commonly known as:  ACTIGALL Take 500 mg by mouth 2 (two) times daily.      Follow-up Information    Koirala, Dibas, MD Follow up.   Specialty:  Family Medicine Contact information: [address] [place] [address] [phone]        Vida Rigger, MD. Schedule an appointment as soon as possible for a visit in 1 month(s).   Specialty:  Gastroenterology Contact information: [address] [place] [address] [phone]          Allergies  Allergen Reactions  . Aspirin Other (See Comments)    "GI problems" Ok with Lodine, Advil, Ibuprofen  . Patanol [Olopatadine Hcl] Swelling    Consultations/procedures:  EGD 04/24/2018 Dr. Ewing Schlein: Impression: - Small hiatal hernia. - Chronic gastritis with minimal hemorrhage. -? Dieulafoy lesion of stomach. 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 - The examination was otherwise normal. -  Normal larynx. - No specimens collected. Recommendation: Patient has a contact number available for  emergencies. The signs and symptoms of potential  delayed complications were discussed with the  patient. Return to normal activities tomorrow.  Written discharge instructions were provided to the  patient. - Clear liquid diet today. may have soft solids  tomorrow if no further bleeding - Continue present medications. may change to oral  pump inhibitors tomorrow - Return to GI clinic in 4 weeks. no aspirin or  nonsteroidals long-term - Telephone GI clinic if symptomatic PRN. her CT  last year implied cirrhosis await liver chest and  albumin scheduled to be ordered tomorrow and  consider further workup as an outpatient if needed  Subjective: Feels well. Normal BM last night and no bleeding, N/V/D since. No abd pain.   Discharge Exam: Vitals:   04/25/18 0616 04/25/18 0957  BP: 118/68 133/77  Pulse: 80 72  Resp: 20 17  Temp: 98.6 F (37 C) (!) 97.5 F (36.4 C)  SpO2: 100% 100%   General: Pt is alert, awake, not in acute distress Cardiovascular: RRR, S1/S2 +, no rubs, no gallops Respiratory: CTA bilaterally, no wheezing, no rhonchi Abdominal: Soft, NT, ND, bowel sounds + Extremities: No edema, no cyanosis  Labs: BNP (last  3 results) No results for input(s): BNP in the last 8760 hours. Basic Metabolic Panel: Recent Labs  Lab 04/23/18 1126 04/24/18 1512  NA 137 140  K 3.7 4.5  CL 99 108  CO2 27 23  GLUCOSE 279* 118*  BUN 36* 11  CREATININE 0.73 0.63  CALCIUM 9.3  8.3*   Liver Function Tests: Recent Labs  Lab 04/24/18 1512  AST 63*  ALT 42  ALKPHOS 73  BILITOT 1.1  PROT 6.0*  ALBUMIN 3.0*   No results for input(s): LIPASE, AMYLASE in the last 168 hours. No results for input(s): AMMONIA in the last 168 hours. CBC: Recent Labs  Lab 04/23/18 1126 04/23/18 1524 04/23/18 2104 04/24/18 0930 04/24/18 1619 04/24/18 2044 04/25/18 0758  WBC 20.0* 16.7*  --   --  9.3 10.2 8.5  HGB 7.7* 7.0* 6.5* 8.2* 9.0* 9.6* 9.2*  HCT 24.7* 22.7* 20.8* 25.2* 28.1* 28.5* 27.9*  MCV 89.2 90.1  --   --  88.6 88.0 87.5  PLT 192 155  --   --  73* 115* 117*   Cardiac Enzymes: No results for input(s): CKTOTAL, CKMB, CKMBINDEX, TROPONINI in the last 168 hours. BNP: Invalid input(s): POCBNP CBG: Recent Labs  Lab 04/24/18 1647 04/24/18 2009 04/25/18 0025 04/25/18 0356 04/25/18 0743  GLUCAP 140* 113* 117* 121* 128*   D-Dimer No results for input(s): DDIMER in the last 72 hours. Hgb A1c Recent Labs    04/23/18 1524  HGBA1C 7.4*   Lipid Profile No results for input(s): CHOL, HDL, LDLCALC, TRIG, CHOLHDL, LDLDIRECT in the last 72 hours. Thyroid function studies Recent Labs    04/23/18 1524  TSH 3.224   Anemia work up No results for input(s): VITAMINB12, FOLATE, FERRITIN, TIBC, IRON, RETICCTPCT in the last 72 hours. Urinalysis    Component Value Date/Time   COLORURINE YELLOW 04/23/2018 1126   APPEARANCEUR CLEAR 04/23/2018 1126   LABSPEC <1.005 (L) 04/23/2018 1126   PHURINE 6.0 04/23/2018 1126   GLUCOSEU >=500 (A) 04/23/2018 1126   HGBUR SMALL (A) 04/23/2018 1126   BILIRUBINUR NEGATIVE 04/23/2018 1126   KETONESUR NEGATIVE 04/23/2018 1126   PROTEINUR NEGATIVE 04/23/2018 1126   NITRITE NEGATIVE 04/23/2018 1126   LEUKOCYTESUR NEGATIVE 04/23/2018 1126   Time coordinating discharge: Approximately 40 minutes  Tyrone Nine, MD  [facility] 04/25/2018, 12:05 PM Pager [phone]

## 2018-05-23 ENCOUNTER — Encounter: Payer: BLUE CROSS/BLUE SHIELD | Attending: Family Medicine | Admitting: Skilled Nursing Facility1

## 2018-05-23 ENCOUNTER — Encounter: Payer: Self-pay | Admitting: Skilled Nursing Facility1

## 2018-05-23 DIAGNOSIS — E119 Type 2 diabetes mellitus without complications: Secondary | ICD-10-CM | POA: Insufficient documentation

## 2018-05-23 DIAGNOSIS — Z713 Dietary counseling and surveillance: Secondary | ICD-10-CM | POA: Insufficient documentation

## 2018-05-23 NOTE — Progress Notes (Signed)
Diabetes Self-Management Education  Visit Type: First/Initial  05/23/2018  Ms. Rachel Banks, identified by name and date of birth, is a 62 y.o. female with a diagnosis of Diabetes: Type 2.   ASSESSMENT  There were no vitals taken for this visit. There is no height or weight on file to calculate BMI.  Pts A1C 7.4. Pt states she smokes half a pack a day and is going to make plans to quit. Pt states she checks her fasting: 180, 175 and 2 hrs post prandial: 110 Pt was very attentive during the appt.   Diabetes Self-Management Education - 05/23/18 1058      Visit Information   Visit Type  First/Initial      Initial Visit   Diabetes Type  Type 2    Are you currently following a meal plan?  No    Are you taking your medications as prescribed?  Yes      Health Coping   How would you rate your overall health?  Excellent      Psychosocial Assessment   Patient Belief/Attitude about Diabetes  Motivated to manage diabetes      Pre-Education Assessment   Patient understands the diabetes disease and treatment process.  Needs Instruction    Patient understands incorporating nutritional management into lifestyle.  Needs Instruction    Patient undertands incorporating physical activity into lifestyle.  Needs Instruction    Patient understands using medications safely.  Needs Instruction    Patient understands monitoring blood glucose, interpreting and using results  Needs Instruction    Patient understands prevention, detection, and treatment of acute complications.  Needs Instruction    Patient understands prevention, detection, and treatment of chronic complications.  Needs Instruction    Patient understands how to develop strategies to address psychosocial issues.  Needs Instruction    Patient understands how to develop strategies to promote health/change behavior.  Needs Instruction      Complications   Last HgB A1C per patient/outside source  7.4 %    How often do you check your  blood sugar?  1-2 times/day    Fasting Blood glucose range (mg/dL)  [phone]    Number of hypoglycemic episodes per month  0    Have you had a dilated eye exam in the past 12 months?  Yes    Have you had a dental exam in the past 12 months?  Yes    Are you checking your feet?  Yes    How many days per week are you checking your feet?  7      Dietary Intake   Breakfast  coffee with altenative sugars     Lunch  fast food salad or hamburger     Snack (afternoon)  chips     Dinner  beef tips and potatoes     Beverage(s)  water, diet soda       Exercise   Exercise Type  Light (walking / raking leaves)    How many days per week to you exercise?  5    How many minutes per day do you exercise?  30    Total minutes per week of exercise  150      Patient Education   Previous Diabetes Education  No    Disease state   Definition of diabetes, type 1 and 2, and the diagnosis of diabetes    Nutrition management   Role of diet in the treatment of diabetes and the relationship between the three main macronutrients  and blood glucose level;Food label reading, portion sizes and measuring food.;Carbohydrate counting;Information on hints to eating out and maintain blood glucose control.;Reviewed blood glucose goals for pre and post meals and how to evaluate the patients' food intake on their blood glucose level.    Physical activity and exercise   Identified with patient nutritional and/or medication changes necessary with exercise.;Role of exercise on diabetes management, blood pressure control and cardiac health.    Monitoring  Purpose and frequency of SMBG.;Taught/evaluated SMBG meter.;Daily foot exams;Yearly dilated eye exam;Identified appropriate SMBG and/or A1C goals.    Acute complications  Taught treatment of hypoglycemia - the 15 rule.;Discussed and identified patients' treatment of hyperglycemia.    Chronic complications  Assessed and discussed foot care and prevention of foot problems;Dental  care;Lipid levels, blood glucose control and heart disease;Retinopathy and reason for yearly dilated eye exams    Psychosocial adjustment  Role of stress on diabetes;Worked with patient to identify barriers to care and solutions;Brainstormed with patient on coping mechanisms for social situations, getting support from significant others, dealing with feelings about diabetes;Helped patient identify a support system for diabetes management;Identified and addressed patients feelings and concerns about diabetes    Personal strategies to promote health  Review risk of smoking and offered smoking cessation;Lifestyle issues that need to be addressed for better diabetes care;Helped patient develop diabetes management plan for (enter comment)      Individualized Goals (developed by patient)   Nutrition  Follow meal plan discussed;General guidelines for healthy choices and portions discussed;Adjust meds/carbs with exercise as discussed    Physical Activity  30 minutes per day;Exercise 5-7 days per week    Reducing Risk  stop smoking      Post-Education Assessment   Patient understands the diabetes disease and treatment process.  Demonstrates understanding / competency    Patient understands incorporating nutritional management into lifestyle.  Demonstrates understanding / competency    Patient undertands incorporating physical activity into lifestyle.  Demonstrates understanding / competency    Patient understands using medications safely.  Demonstrates understanding / competency    Patient understands monitoring blood glucose, interpreting and using results  Demonstrates understanding / competency    Patient understands prevention, detection, and treatment of acute complications.  Demonstrates understanding / competency    Patient understands prevention, detection, and treatment of chronic complications.  Demonstrates understanding / competency    Patient understands how to develop strategies to address  psychosocial issues.  Demonstrates understanding / competency    Patient understands how to develop strategies to promote health/change behavior.  Demonstrates understanding / competency      Outcomes   Expected Outcomes  Demonstrated interest in learning. Expect positive outcomes    Future DMSE  PRN    Program Status  Completed       Individualized Plan for Diabetes Self-Management Training:   Learning Objective:  Patient will have a greater understanding of diabetes self-management. Patient education plan is to attend individual and/or group sessions per assessed needs and concerns.   Plan:   Patient Instructions  -Always bring your meter with you everywhere you go -Always Properly dispose of your needles:  -Discard in a hard plastic/metal container with a lid (something the needle can't puncture)  -Write Do Not Recycle on the outside of the container  -Example: A laundry detergent bottle -Never use the same needle more than once -Eat 2-3 carbohydrate choices for each meal and 1 for each snack -A meal: carbohydrates, protein, vegetable -A snack: A Fruit OR Vegetable  AND Protein  -Try to be more active -Always pay attention to your body keeping watchful of possible low blood sugar (below 70) or high blood sugar (above 200)  -Check your feet every day looking for anything that was not there the day before   -Find a stress relief mechanism other than smoking: such as music, reading, dancing, etc.    Expected Outcomes:  Demonstrated interest in learning. Expect positive outcomes  Education material provided: ADA Diabetes: Your Take Control Guide, A1C conversion sheet, Meal plan card, My Plate, Snack sheet and Support group flyer  If problems or questions, patient to contact team via:  Phone  Future DSME appointment: PRN

## 2018-05-23 NOTE — Patient Instructions (Signed)
-  Always bring your meter with you everywhere you go -Always Properly dispose of your needles:  -Discard in a hard plastic/metal container with a lid (something the needle can't puncture)  -Write Do Not Recycle on the outside of the container  -Example: A laundry detergent bottle -Never use the same needle more than once -Eat 2-3 carbohydrate choices for each meal and 1 for each snack -A meal: carbohydrates, protein, vegetable -A snack: A Fruit OR Vegetable AND Protein  -Try to be more active -Always pay attention to your body keeping watchful of possible low blood sugar (below 70) or high blood sugar (above 200)  -Check your feet every day looking for anything that was not there the day before   -Find a stress relief mechanism other than smoking: such as music, reading, dancing, etc.

## 2018-05-31 ENCOUNTER — Other Ambulatory Visit: Payer: Self-pay | Admitting: Gastroenterology

## 2018-05-31 DIAGNOSIS — K7460 Unspecified cirrhosis of liver: Secondary | ICD-10-CM

## 2018-06-05 ENCOUNTER — Other Ambulatory Visit: Payer: BLUE CROSS/BLUE SHIELD

## 2018-12-05 ENCOUNTER — Other Ambulatory Visit: Payer: Self-pay | Admitting: Gastroenterology

## 2019-09-05 IMAGING — CT CT ABD-PELV W/ CM
1 of 3 series · 13 of 32 positions shown, 18 images · IV contrast (APPLIED)
Comparison: CT the abdomen and pelvis 02/01/2014.

CLINICAL DATA: 61-year-old female with history of elevated liver
enzymes.

EXAM:
CT ABDOMEN AND PELVIS WITH CONTRAST
TECHNIQUE: Multidetector CT imaging of the abdomen and pelvis was performed
using the standard protocol following bolus administration of
intravenous contrast.
CONTRAST:  100mL MWHJ9F-BEE IOPAMIDOL (MWHJ9F-BEE) INJECTION 61%

[Series 2: abd/pelvis w/cm · axial · 0.71mm/px · z∈[-432,-32]mm · 13 of 90 slices shown, 18 images]
[im 5/90  soft-tissue]
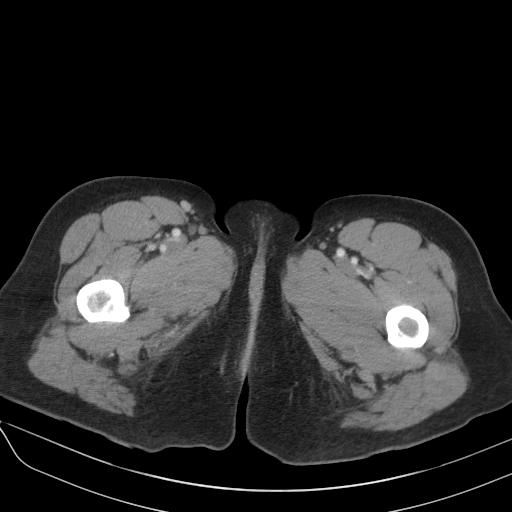
[im 5/90  bone]
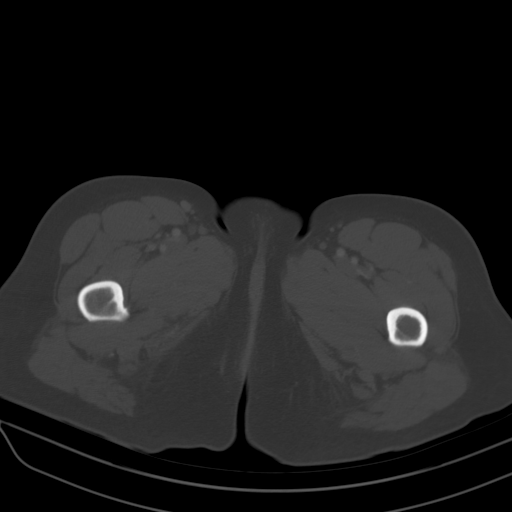
[im 15/90  soft-tissue]
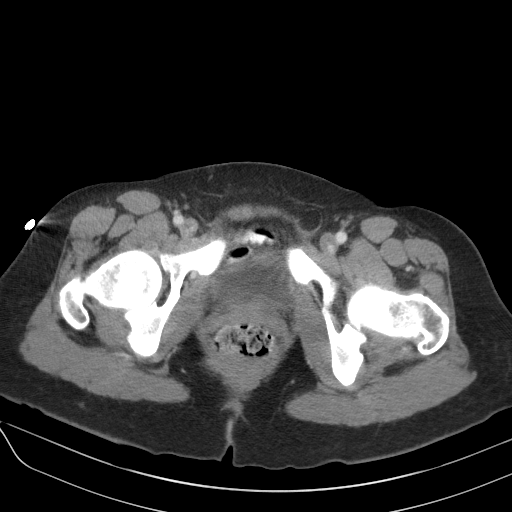
[im 19/90  soft-tissue]
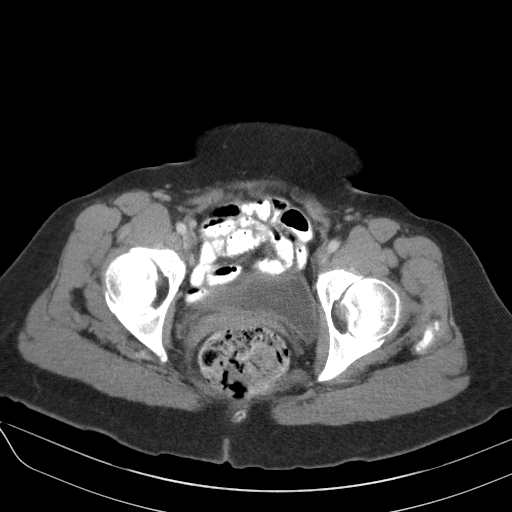
[im 29/90  soft-tissue]
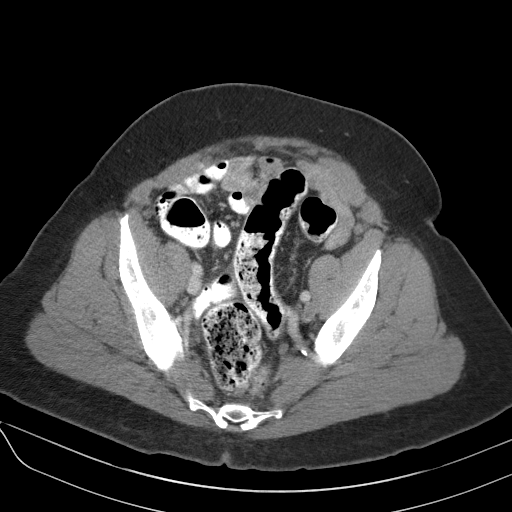
[im 33/90  soft-tissue]
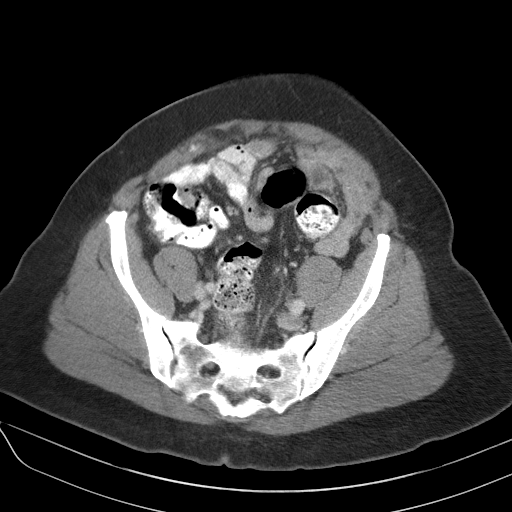
[im 43/90  soft-tissue]
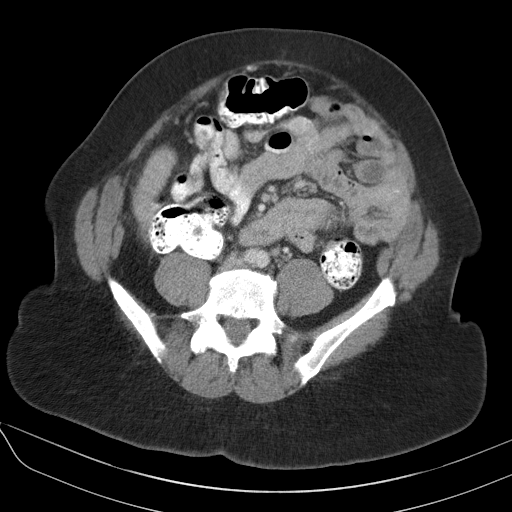
[im 47/90  soft-tissue]
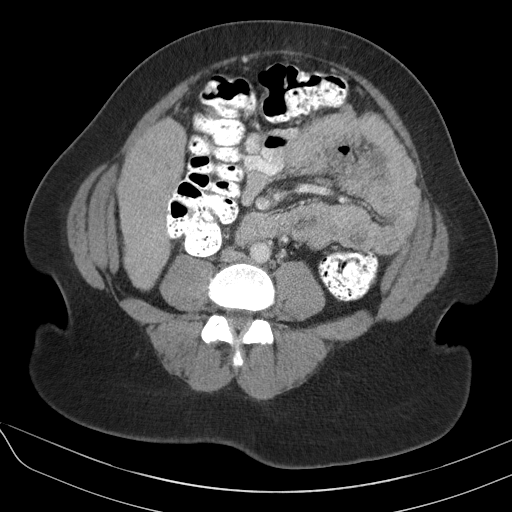
[im 57/90  soft-tissue]
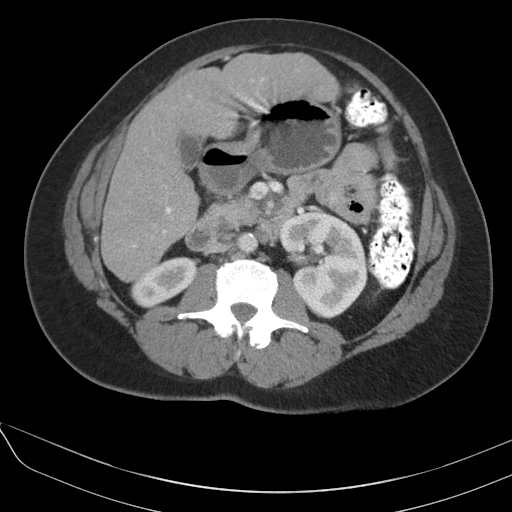
[im 61/90  soft-tissue]
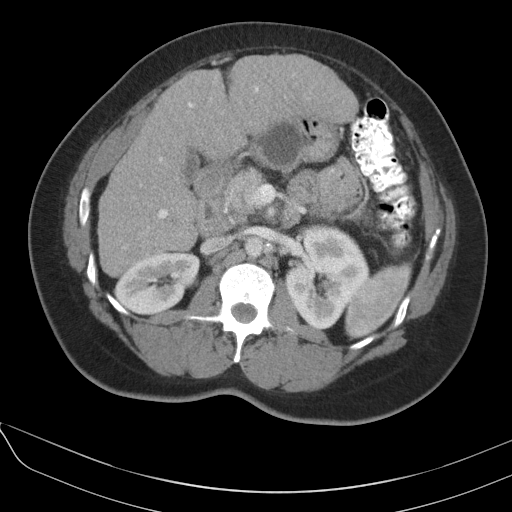
[im 61/90  bone]
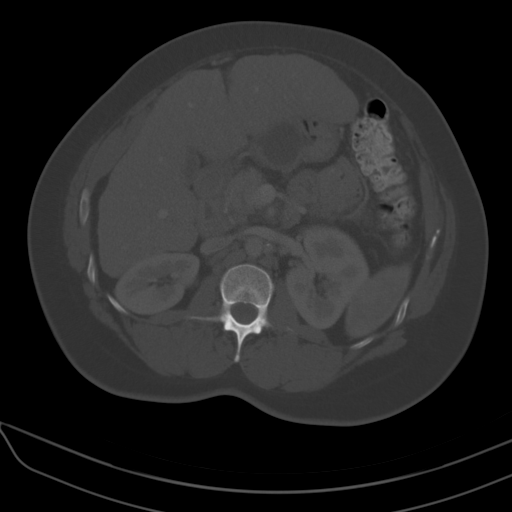
[im 71/90  soft-tissue]
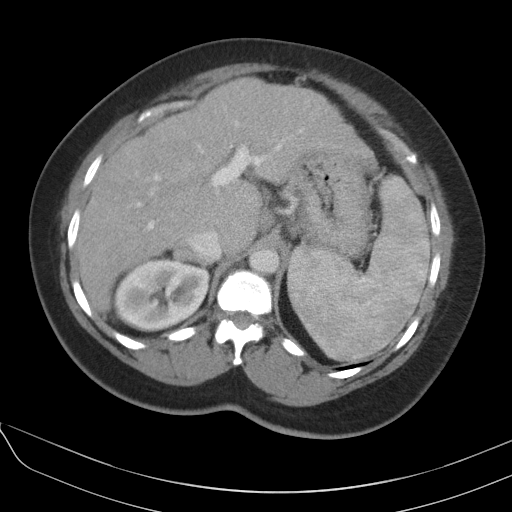
[im 71/90  lung]
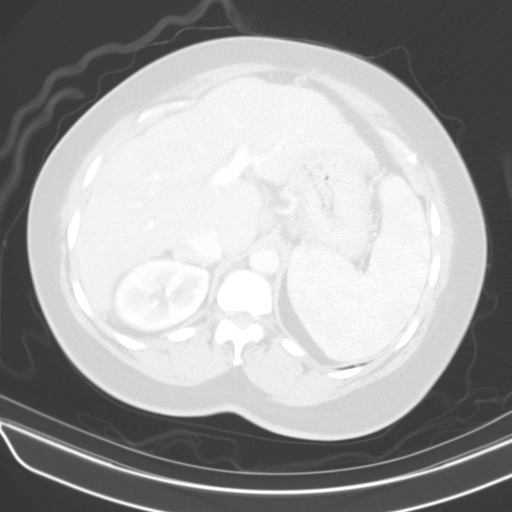
[im 75/90  soft-tissue]
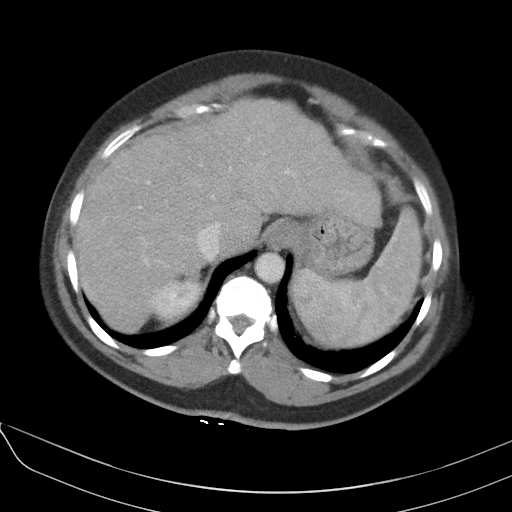
[im 75/90  lung]
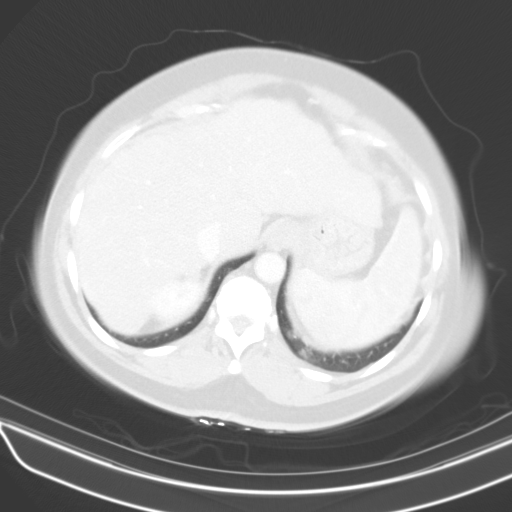
[im 80/90  lung]
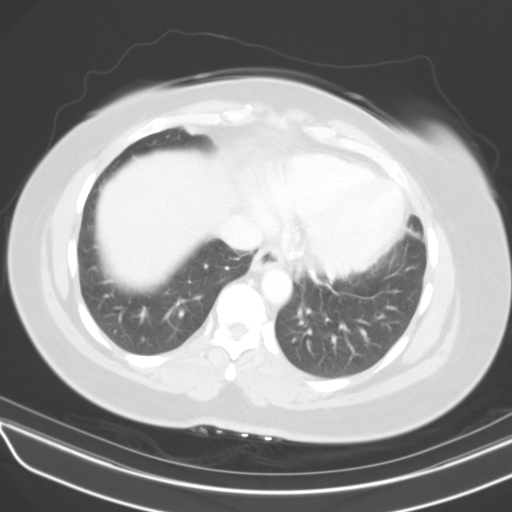
[im 85/90  soft-tissue]
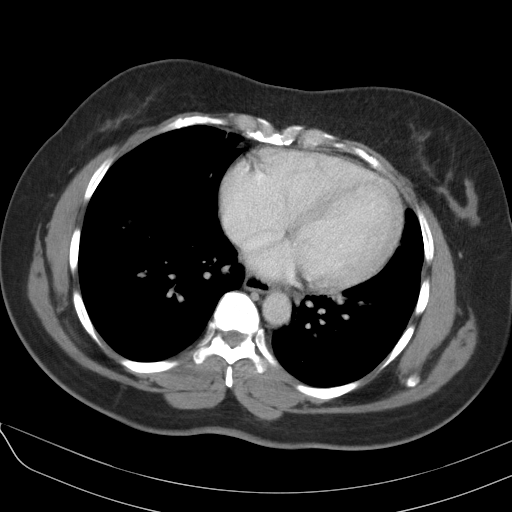
[im 85/90  lung]
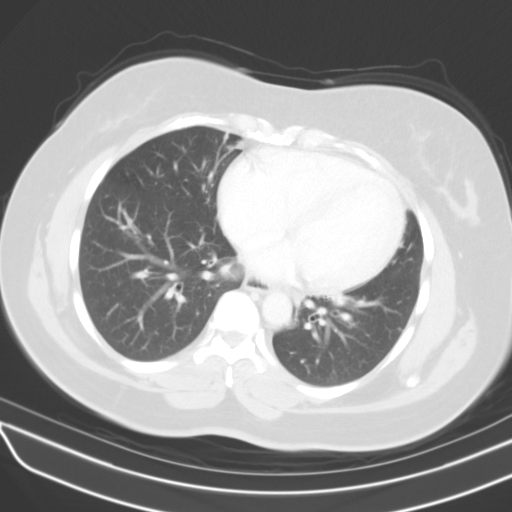

[13 of 32 positions shown; findings below may reference images not displayed]

FINDINGS: Lower chest: Scarring in the right middle lobe. 4 mm subpleural
nodule associated with the left major fissure is statistically
likely a benign subpleural lymph node.

Hepatobiliary: The liver has a shrunken appearance and non contour,
indicative of underlying cirrhosis. No discrete cystic or solid
hepatic lesions. No intra or extrahepatic biliary ductal dilatation.
3 mm calcified gallstone lying dependently in the neck of the
gallbladder. Gallbladder is otherwise unremarkable in appearance.

Pancreas: No pancreatic mass. No pancreatic ductal dilatation. No
pancreatic or peripancreatic fluid or inflammatory changes.

Spleen: Unremarkable.

Adrenals/Urinary Tract: Bilateral kidneys and bilateral adrenal
glands are normal in appearance. No hydroureteronephrosis. Urinary
bladder is normal in appearance.

Stomach/Bowel: Normal appearance of the stomach. No pathologic
dilatation of small bowel or colon. The appendix is not confidently
identified and may be surgically absent. Regardless, there are no
inflammatory changes noted adjacent to the cecum to suggest the
presence of an acute appendicitis at this time.

Vascular/Lymphatic: Aortic atherosclerosis, without evidence of
aneurysm or dissection in the abdominal or pelvic vasculature.
Extensive soft tissue thickening adjacent to the superior mesenteric
artery, concerning for an underlying vasculitis. No definite luminal
narrowing. Similar findings are present to a lesser degree
associated with the celiac axis. No lymphadenopathy noted in the
abdomen or pelvis.

Reproductive: Status posthysterectomy. Ovaries are not confidently
identified may be surgically absent or atrophic.

Other: No significant volume of ascites.  No pneumoperitoneum.

Musculoskeletal: There are no aggressive appearing lytic or blastic
lesions noted in the visualized portions of the skeleton.
IMPRESSION: 1. Morphologic changes in the liver compatible with cirrhosis
redemonstrated. No suspicious hepatic lesions are noted at this
time.
2. New soft tissue thickening around the superior mesenteric artery,
and to a lesser extent around the celiac axis. There is no
associated luminal narrowing or aneurysmal dilatation of either
vessel at this time, however, these findings are highly concerning
for vasculitis.
3. Cholelithiasis without evidence of acute cholecystitis at this
time.
4. Additional incidental findings, as above.

## 2019-11-09 IMAGING — MR MR MRA ABDOMEN W/ OR W/O CM
9 of 13 series · 26 of 48 positions shown · IV contrast (12 ml multihance)
Comparison: CT 06/10/2017, 02/01/2014

CLINICAL DATA: 61-year-old female with questionable vasculitis on
prior CT exam.

EXAM:
MRI ABDOMEN WITHOUT AND WITH CONTRAST
TECHNIQUE: Multiplanar multisequence MR imaging of the abdomen was performed
both before and after the administration of intravenous contrast.
CONTRAST:  12mL MULTIHANCE GADOBENATE DIMEGLUMINE 529 MG/ML IV SOLN

[Series 3: bSSFP · coronal · 5.0mm · 0.78mm/px · 3 of 22 slices shown (1 of 2)]
[im 1/22]
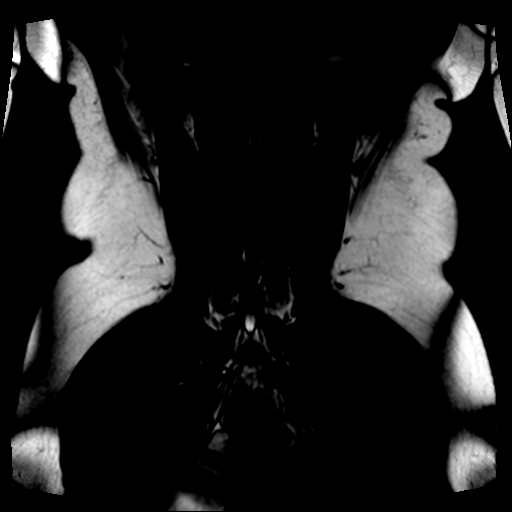
[im 11/22]
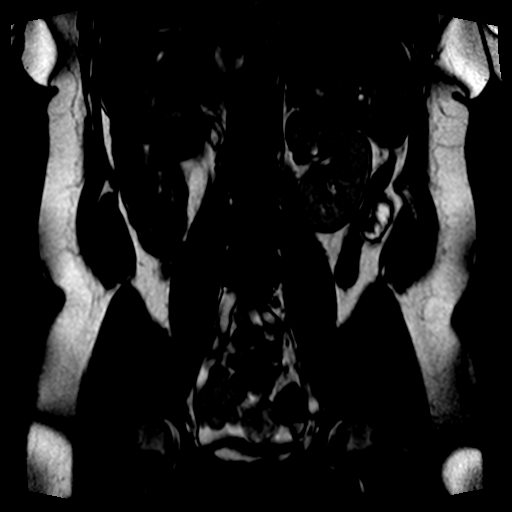
[im 22/22]
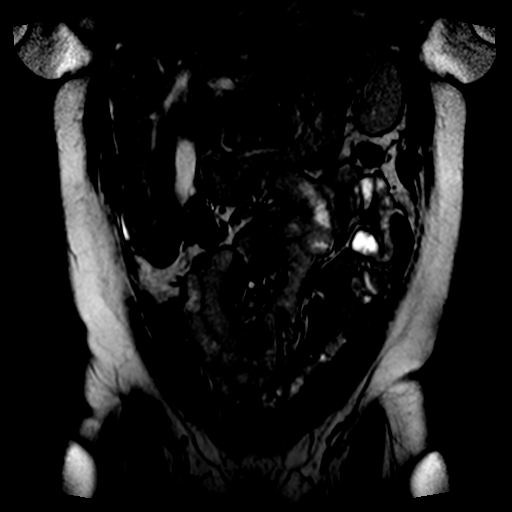

[Series 4: bSSFP · axial · 6.0mm · 0.74mm/px · z∈[-10,+148]mm · 2 of 23 slices shown (2 of 2)]
[im 1/23]
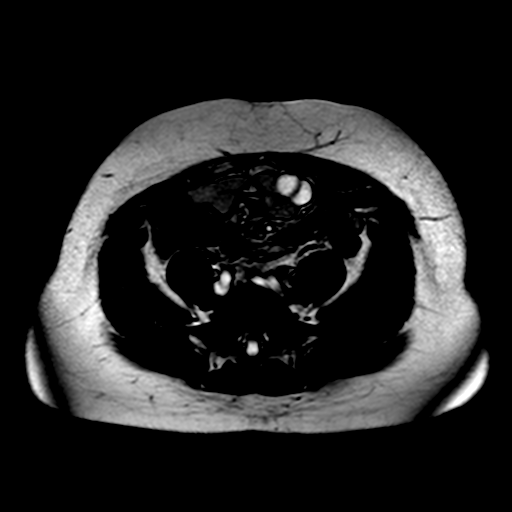
[im 23/23]
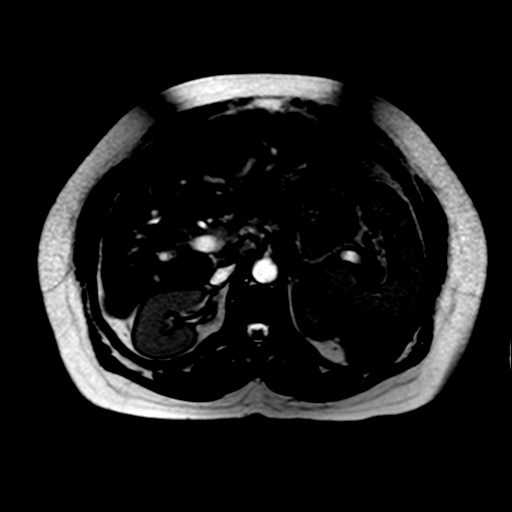

[Series 6: T2 · axial · 6.0mm · 0.94mm/px · z∈[+2,+240]mm · 2 of 34 slices shown]
[im 1/34]
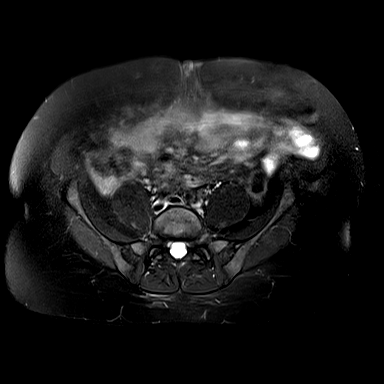
[im 34/34]
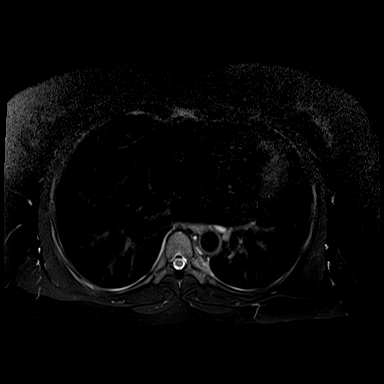

[Series 7: T2 fat-sat · coronal · 5.0mm · 0.74mm/px · 1 of 21 slices shown]
[im 1/21]
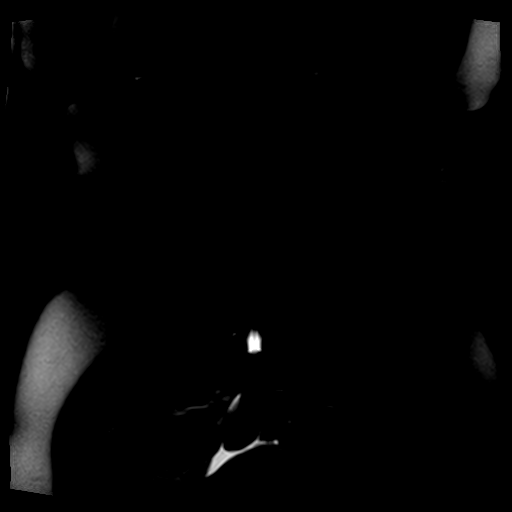

[Series 8: T1 fat-sat · coronal · 5.0mm · 0.74mm/px · 1 of 17 slices shown]
[im 1/17]
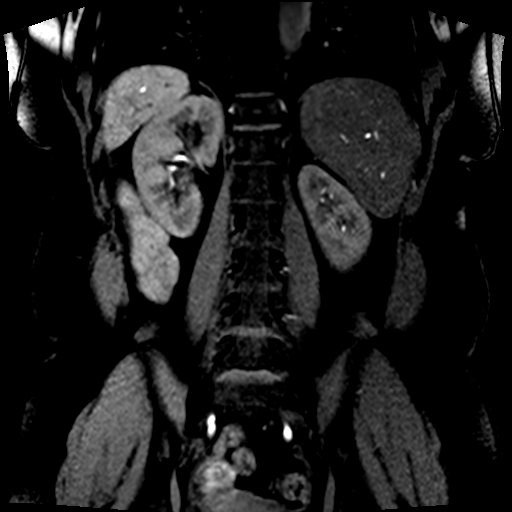

[Series 9: ep2d_diff_b50_500_800_p2_trig · axial · 6.0mm · 1.98mm/px · z∈[-1,+244]mm · 7 of 105 slices shown]
[im 1/105]
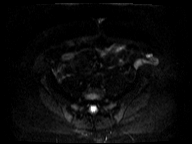
[im 18/105]
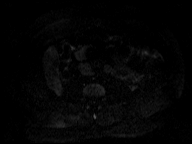
[im 35/105]
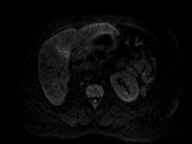
[im 53/105]
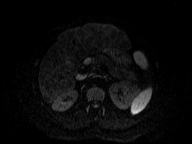
[im 70/105]
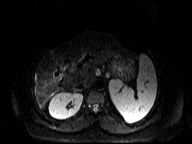
[im 87/105]
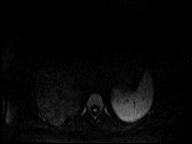
[im 105/105]
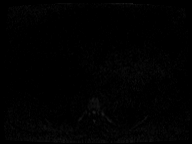

[Series 10: ep2d_[id number]_800_p2_trig_adc · axial · 6.0mm · 1.98mm/px · z∈[-1,+244]mm · 2 of 35 slices shown]
[im 1/35]
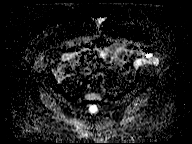
[im 35/35]
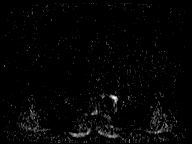

[Series 11: T1 dynamic fat-sat · axial · non-contrast · 3.5mm · 0.74mm/px · z∈[+41,+233]mm · 4 of 56 slices shown]
[im 1/56]
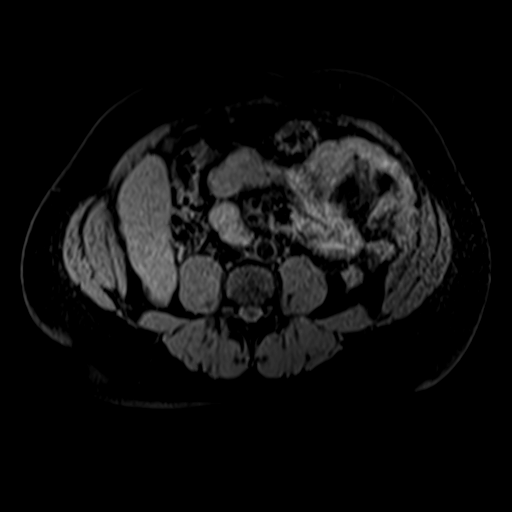
[im 19/56]
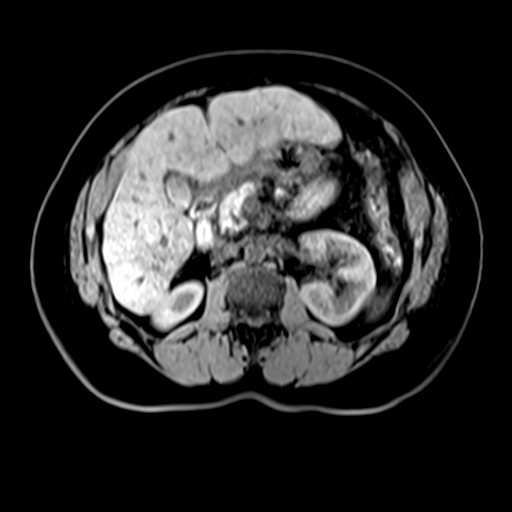
[im 37/56]
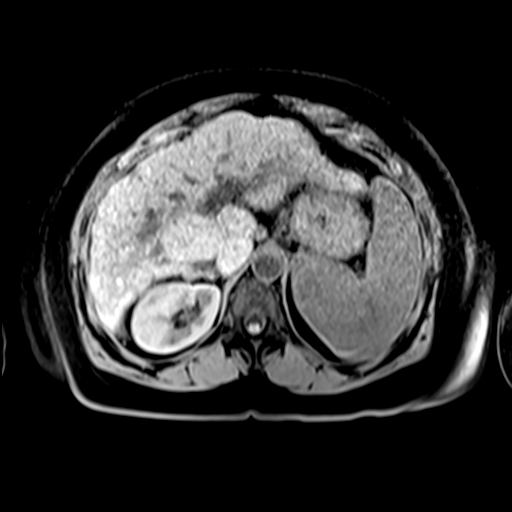
[im 56/56]
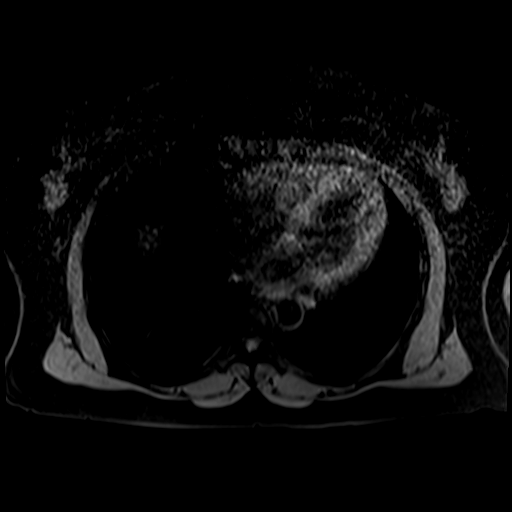

[Series 13: fl3d_cor · coronal · 1.2mm · 0.62mm/px · 4 of 96 slices shown]
[im 1/96]
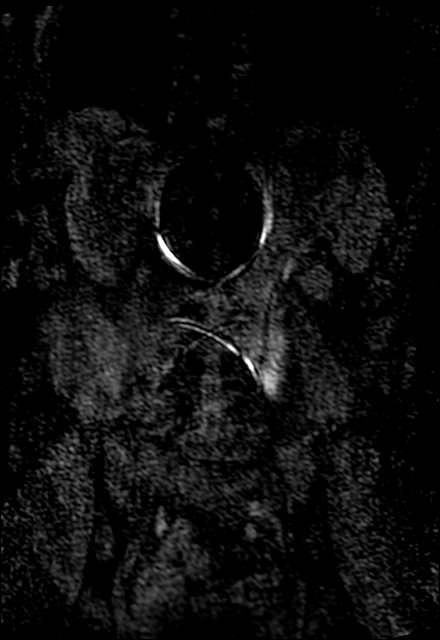
[im 16/96]
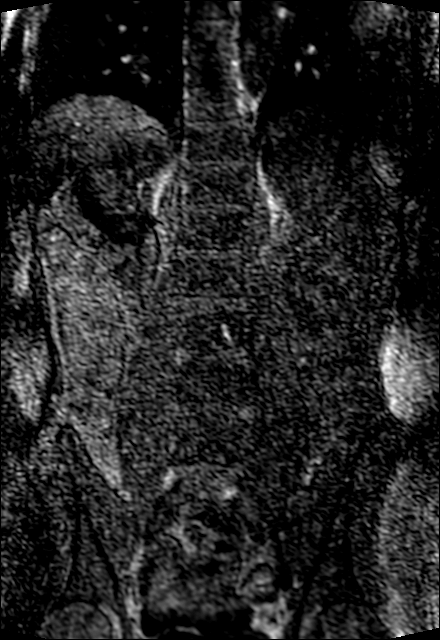
[im 32/96]
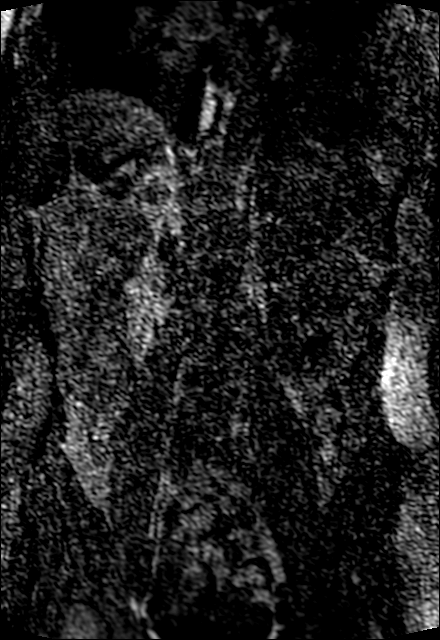
[im 48/96]
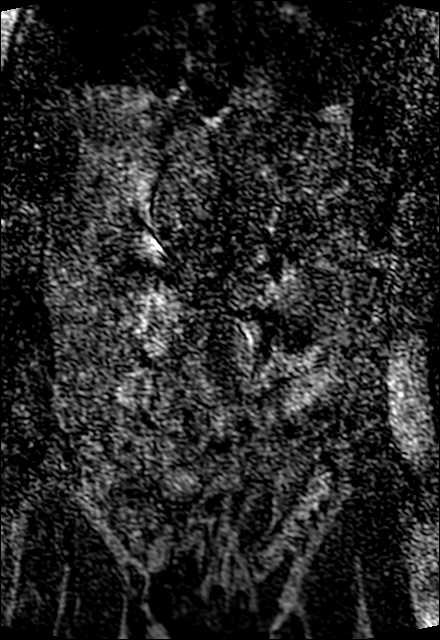

[26 of 48 positions shown; findings below may reference images not displayed]

FINDINGS: Nonvascular:

Lower chest: Unremarkable

Hepatobiliary: Cirrhotic morphologic changes of the liver again
noted with enlarging left lobe and caudate lobe, and decreased
relative size of the posterior segments of the right liver. Nodular
surface again noted, evident on prior CT.

Uniformly hypointense signal on T2 sequence with intermediate to
high intensity signal on T1 sequence. No focal lesion identified on
this study. Postcontrast demonstrates no focal enhancement, with
heterogeneous enhancement of the parenchyma.

Pancreas:  Unremarkable appearance of pancreas

Spleen:  Enlarged spleen again noted.

Adrenals/Urinary Tract: Unremarkable appearance of the adrenal
glands. Unremarkable kidneys.

Stomach/Bowel: Unremarkable appearance of the visualized bowel.

No adenopathy.

Vascular:

Aorta: Unremarkable course caliber and contour of the abdominal
aorta, with mild tortuosity seen on the prior. No dissection or
aneurysm. No evidence of soft tissue thickening surrounding the
aorta or the iliac vasculature. No significant plaque.

Celiac artery: Celiac artery is patent, though perhaps 50% stenotic
at the origin secondary to calcified plaque. Patent left gastric
artery, common hepatic artery, splenic artery. The axial
postcontrast images demonstrate questionable persistence of
inflammatory changes at the proximal celiac artery, which is
relatively unchanged from the prior CT.

SMA: Superior mesenteric arteries patent with no stenosis at the
origin. Axial postcontrast images again demonstrate circumferential
low-density tissue at the superior mesenteric artery with no
high-grade stenosis or occlusion. Distribution is similar to that of
the prior CT with no evidence of significant worsening. 3D
reconstruction demonstrates arcade vasculature and ileal colic
artery patent.

IMA:  IMA is patent with maintained flow signal.

Renal arteries: Bilateral single renal arteries which are patent.
Atherosclerotic changes present at the origin the bilateral renal
arteries. Symmetric perfusion of the kidneys.

Other:  None

Musculoskeletal: Unremarkable signal of the visualized vertebral
bodies and pelvis.
IMPRESSION: The MRA demonstrates similar appearance of circumferential soft
tissue surrounding the proximal celiac artery and superior
mesenteric artery, compatible with vasculitis. Compared across
modalities to the prior CT, no evidence of significant progression,
with no evidence of occlusion or high-grade stenosis.

Atherosclerotic changes at the origin of the celiac artery also
contributes to narrowing.

Atherosclerotic changes at the origin the bilateral renal arteries,
which are patent.

Re- demonstration of liver cirrhosis and evidence of portal
hypertension.

## 2020-01-03 ENCOUNTER — Other Ambulatory Visit: Payer: Self-pay | Admitting: Gastroenterology

## 2020-01-03 DIAGNOSIS — K7460 Unspecified cirrhosis of liver: Secondary | ICD-10-CM

## 2020-01-11 ENCOUNTER — Other Ambulatory Visit: Payer: BLUE CROSS/BLUE SHIELD

## 2020-01-15 ENCOUNTER — Ambulatory Visit
Admission: RE | Admit: 2020-01-15 | Discharge: 2020-01-15 | Disposition: A | Discharge status: Home / Self Care | Payer: BC Managed Care – PPO | Source: Ambulatory Visit | Attending: Gastroenterology | Admitting: Gastroenterology

## 2020-01-15 DIAGNOSIS — K7460 Unspecified cirrhosis of liver: Secondary | ICD-10-CM

## 2020-07-22 ENCOUNTER — Other Ambulatory Visit: Payer: Self-pay | Admitting: Gastroenterology

## 2020-07-22 DIAGNOSIS — K7460 Unspecified cirrhosis of liver: Secondary | ICD-10-CM

## 2020-07-23 ENCOUNTER — Ambulatory Visit
Admission: RE | Admit: 2020-07-23 | Discharge: 2020-07-23 | Disposition: A | Discharge status: Home / Self Care | Payer: BC Managed Care – PPO | Source: Ambulatory Visit | Attending: Gastroenterology | Admitting: Gastroenterology

## 2020-07-23 DIAGNOSIS — K7460 Unspecified cirrhosis of liver: Secondary | ICD-10-CM

## 2021-01-26 ENCOUNTER — Other Ambulatory Visit: Payer: Self-pay | Admitting: Gastroenterology

## 2021-01-26 DIAGNOSIS — K7460 Unspecified cirrhosis of liver: Secondary | ICD-10-CM

## 2021-02-11 ENCOUNTER — Ambulatory Visit
Admission: RE | Admit: 2021-02-11 | Discharge: 2021-02-11 | Disposition: A | Discharge status: Home / Self Care | Payer: BC Managed Care – PPO | Source: Ambulatory Visit | Attending: Gastroenterology | Admitting: Gastroenterology

## 2021-02-11 ENCOUNTER — Other Ambulatory Visit: Payer: Self-pay

## 2021-02-11 DIAGNOSIS — K7460 Unspecified cirrhosis of liver: Secondary | ICD-10-CM

## 2021-06-16 ENCOUNTER — Emergency Department (HOSPITAL_BASED_OUTPATIENT_CLINIC_OR_DEPARTMENT_OTHER): Payer: Medicare Other

## 2021-06-16 ENCOUNTER — Other Ambulatory Visit: Payer: Self-pay

## 2021-06-16 ENCOUNTER — Emergency Department (HOSPITAL_BASED_OUTPATIENT_CLINIC_OR_DEPARTMENT_OTHER)
Admission: EM | Admit: 2021-06-16 | Discharge: 2021-06-16 | Disposition: A | Discharge status: Home / Self Care | Payer: Medicare Other | Attending: Emergency Medicine | Admitting: Emergency Medicine

## 2021-06-16 ENCOUNTER — Encounter (HOSPITAL_BASED_OUTPATIENT_CLINIC_OR_DEPARTMENT_OTHER): Payer: Self-pay | Admitting: *Deleted

## 2021-06-16 DIAGNOSIS — M79605 Pain in left leg: Secondary | ICD-10-CM | POA: Insufficient documentation

## 2021-06-16 DIAGNOSIS — Z79899 Other long term (current) drug therapy: Secondary | ICD-10-CM | POA: Diagnosis not present

## 2021-06-16 DIAGNOSIS — M791 Myalgia, unspecified site: Secondary | ICD-10-CM | POA: Insufficient documentation

## 2021-06-16 DIAGNOSIS — S3992XA Unspecified injury of lower back, initial encounter: Secondary | ICD-10-CM | POA: Diagnosis present

## 2021-06-16 DIAGNOSIS — M546 Pain in thoracic spine: Secondary | ICD-10-CM | POA: Diagnosis not present

## 2021-06-16 DIAGNOSIS — M25512 Pain in left shoulder: Secondary | ICD-10-CM | POA: Insufficient documentation

## 2021-06-16 DIAGNOSIS — I10 Essential (primary) hypertension: Secondary | ICD-10-CM | POA: Insufficient documentation

## 2021-06-16 DIAGNOSIS — Y92410 Unspecified street and highway as the place of occurrence of the external cause: Secondary | ICD-10-CM | POA: Diagnosis not present

## 2021-06-16 DIAGNOSIS — R011 Cardiac murmur, unspecified: Secondary | ICD-10-CM | POA: Insufficient documentation

## 2021-06-16 DIAGNOSIS — S39012A Strain of muscle, fascia and tendon of lower back, initial encounter: Secondary | ICD-10-CM | POA: Diagnosis not present

## 2021-06-16 DIAGNOSIS — M542 Cervicalgia: Secondary | ICD-10-CM | POA: Insufficient documentation

## 2021-06-16 DIAGNOSIS — E119 Type 2 diabetes mellitus without complications: Secondary | ICD-10-CM | POA: Diagnosis not present

## 2021-06-16 DIAGNOSIS — F17210 Nicotine dependence, cigarettes, uncomplicated: Secondary | ICD-10-CM | POA: Insufficient documentation

## 2021-06-16 DIAGNOSIS — M7918 Myalgia, other site: Secondary | ICD-10-CM

## 2021-06-16 MED ORDER — CYCLOBENZAPRINE HCL 10 MG PO TABS
10.0000 mg | ORAL_TABLET | Freq: Three times a day (TID) | ORAL | 0 refills | Status: AC
Start: 2021-06-16 — End: ?

## 2021-06-16 NOTE — ED Notes (Signed)
Pt discharged to home. Discharge instructions have been discussed with patient and/or family members. Pt verbally acknowledges understanding d/c instructions, and endorses comprehension to checkout at registration before leaving.  °

## 2021-06-16 NOTE — Discharge Instructions (Addendum)
Xrays negative for fractures in left shoulder, upper and lower back. Pain is likely musculoskeletal in origin. Treat with ice, rest and incorporate physical activity as described in discharge summary. Use Tylenol as needed for pain management. Return if symptoms worsen. Follow up with PCP in one week.  For Flexeril: Take 1/2 pill to start (5mg ) and monitor for dizziness. Do not drive motor vehicle after taking.
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
## 2021-08-26 ENCOUNTER — Other Ambulatory Visit: Payer: Self-pay | Admitting: Gastroenterology

## 2021-08-26 DIAGNOSIS — K7469 Other cirrhosis of liver: Secondary | ICD-10-CM

## 2021-08-26 DIAGNOSIS — K8020 Calculus of gallbladder without cholecystitis without obstruction: Secondary | ICD-10-CM

## 2021-09-14 ENCOUNTER — Ambulatory Visit
Admission: RE | Admit: 2021-09-14 | Discharge: 2021-09-14 | Disposition: A | Discharge status: Home / Self Care | Payer: Medicare PPO | Source: Ambulatory Visit | Attending: Gastroenterology | Admitting: Gastroenterology

## 2021-09-14 DIAGNOSIS — K8020 Calculus of gallbladder without cholecystitis without obstruction: Secondary | ICD-10-CM

## 2021-09-14 DIAGNOSIS — K7469 Other cirrhosis of liver: Secondary | ICD-10-CM

## 2021-09-15 ENCOUNTER — Other Ambulatory Visit: Payer: Self-pay | Admitting: Obstetrics & Gynecology

## 2021-09-15 DIAGNOSIS — M8580 Other specified disorders of bone density and structure, unspecified site: Secondary | ICD-10-CM

## 2021-12-25 ENCOUNTER — Other Ambulatory Visit: Payer: Self-pay | Admitting: Gastroenterology

## 2021-12-25 DIAGNOSIS — K8010 Calculus of gallbladder with chronic cholecystitis without obstruction: Secondary | ICD-10-CM

## 2021-12-25 DIAGNOSIS — K7460 Unspecified cirrhosis of liver: Secondary | ICD-10-CM

## 2021-12-31 ENCOUNTER — Ambulatory Visit
Admission: RE | Admit: 2021-12-31 | Discharge: 2021-12-31 | Disposition: A | Discharge status: Home / Self Care | Payer: Medicare PPO | Source: Ambulatory Visit | Attending: Gastroenterology | Admitting: Gastroenterology

## 2021-12-31 DIAGNOSIS — K8020 Calculus of gallbladder without cholecystitis without obstruction: Secondary | ICD-10-CM | POA: Diagnosis not present

## 2021-12-31 DIAGNOSIS — K7460 Unspecified cirrhosis of liver: Secondary | ICD-10-CM

## 2021-12-31 DIAGNOSIS — K8010 Calculus of gallbladder with chronic cholecystitis without obstruction: Secondary | ICD-10-CM

## 2022-01-21 DIAGNOSIS — M722 Plantar fascial fibromatosis: Secondary | ICD-10-CM | POA: Diagnosis not present

## 2022-01-21 DIAGNOSIS — M65342 Trigger finger, left ring finger: Secondary | ICD-10-CM | POA: Diagnosis not present

## 2022-02-15 DIAGNOSIS — E7800 Pure hypercholesterolemia, unspecified: Secondary | ICD-10-CM | POA: Diagnosis not present

## 2022-02-15 DIAGNOSIS — K7460 Unspecified cirrhosis of liver: Secondary | ICD-10-CM | POA: Diagnosis not present

## 2022-02-15 DIAGNOSIS — Z79899 Other long term (current) drug therapy: Secondary | ICD-10-CM | POA: Diagnosis not present

## 2022-02-15 DIAGNOSIS — E1169 Type 2 diabetes mellitus with other specified complication: Secondary | ICD-10-CM | POA: Diagnosis not present

## 2022-02-15 DIAGNOSIS — F331 Major depressive disorder, recurrent, moderate: Secondary | ICD-10-CM | POA: Diagnosis not present

## 2022-02-15 DIAGNOSIS — M722 Plantar fascial fibromatosis: Secondary | ICD-10-CM | POA: Diagnosis not present

## 2022-02-15 DIAGNOSIS — I10 Essential (primary) hypertension: Secondary | ICD-10-CM | POA: Diagnosis not present

## 2022-02-15 DIAGNOSIS — F411 Generalized anxiety disorder: Secondary | ICD-10-CM | POA: Diagnosis not present

## 2022-02-15 DIAGNOSIS — D508 Other iron deficiency anemias: Secondary | ICD-10-CM | POA: Diagnosis not present

## 2022-03-11 ENCOUNTER — Ambulatory Visit
Admission: RE | Admit: 2022-03-11 | Discharge: 2022-03-11 | Disposition: A | Discharge status: Home / Self Care | Payer: Medicare PPO | Source: Ambulatory Visit | Attending: Obstetrics & Gynecology | Admitting: Obstetrics & Gynecology

## 2022-03-11 DIAGNOSIS — M8580 Other specified disorders of bone density and structure, unspecified site: Secondary | ICD-10-CM

## 2022-03-11 DIAGNOSIS — Z780 Asymptomatic menopausal state: Secondary | ICD-10-CM | POA: Diagnosis not present

## 2022-04-15 DIAGNOSIS — K7460 Unspecified cirrhosis of liver: Secondary | ICD-10-CM | POA: Diagnosis not present

## 2022-04-15 DIAGNOSIS — Z86010 Personal history of colonic polyps: Secondary | ICD-10-CM | POA: Diagnosis not present

## 2022-04-15 DIAGNOSIS — K219 Gastro-esophageal reflux disease without esophagitis: Secondary | ICD-10-CM | POA: Diagnosis not present

## 2022-06-14 DIAGNOSIS — R899 Unspecified abnormal finding in specimens from other organs, systems and tissues: Secondary | ICD-10-CM | POA: Diagnosis not present

## 2022-08-02 DIAGNOSIS — K648 Other hemorrhoids: Secondary | ICD-10-CM | POA: Diagnosis not present

## 2022-08-02 DIAGNOSIS — I8500 Esophageal varices without bleeding: Secondary | ICD-10-CM | POA: Diagnosis not present

## 2022-08-02 DIAGNOSIS — K3189 Other diseases of stomach and duodenum: Secondary | ICD-10-CM | POA: Diagnosis not present

## 2022-08-02 DIAGNOSIS — Z09 Encounter for follow-up examination after completed treatment for conditions other than malignant neoplasm: Secondary | ICD-10-CM | POA: Diagnosis not present

## 2022-08-02 DIAGNOSIS — Z13810 Encounter for screening for upper gastrointestinal disorder: Secondary | ICD-10-CM | POA: Diagnosis not present

## 2022-08-02 DIAGNOSIS — Z86010 Personal history of colonic polyps: Secondary | ICD-10-CM | POA: Diagnosis not present

## 2022-08-02 DIAGNOSIS — K766 Portal hypertension: Secondary | ICD-10-CM | POA: Diagnosis not present

## 2022-08-02 DIAGNOSIS — K644 Residual hemorrhoidal skin tags: Secondary | ICD-10-CM | POA: Diagnosis not present

## 2022-08-02 DIAGNOSIS — K5730 Diverticulosis of large intestine without perforation or abscess without bleeding: Secondary | ICD-10-CM | POA: Diagnosis not present

## 2022-09-08 DIAGNOSIS — L439 Lichen planus, unspecified: Secondary | ICD-10-CM | POA: Diagnosis not present

## 2022-09-08 DIAGNOSIS — F320 Major depressive disorder, single episode, mild: Secondary | ICD-10-CM | POA: Diagnosis not present

## 2022-09-08 DIAGNOSIS — K7460 Unspecified cirrhosis of liver: Secondary | ICD-10-CM | POA: Diagnosis not present

## 2022-09-08 DIAGNOSIS — D508 Other iron deficiency anemias: Secondary | ICD-10-CM | POA: Diagnosis not present

## 2022-09-08 DIAGNOSIS — K219 Gastro-esophageal reflux disease without esophagitis: Secondary | ICD-10-CM | POA: Diagnosis not present

## 2022-09-08 DIAGNOSIS — Z79899 Other long term (current) drug therapy: Secondary | ICD-10-CM | POA: Diagnosis not present

## 2022-09-08 DIAGNOSIS — Z0000 Encounter for general adult medical examination without abnormal findings: Secondary | ICD-10-CM | POA: Diagnosis not present

## 2022-09-08 DIAGNOSIS — E7800 Pure hypercholesterolemia, unspecified: Secondary | ICD-10-CM | POA: Diagnosis not present

## 2022-09-08 DIAGNOSIS — E1169 Type 2 diabetes mellitus with other specified complication: Secondary | ICD-10-CM | POA: Diagnosis not present

## 2022-09-09 DIAGNOSIS — E7800 Pure hypercholesterolemia, unspecified: Secondary | ICD-10-CM | POA: Diagnosis not present

## 2022-09-09 DIAGNOSIS — Z79899 Other long term (current) drug therapy: Secondary | ICD-10-CM | POA: Diagnosis not present

## 2022-09-09 DIAGNOSIS — E1169 Type 2 diabetes mellitus with other specified complication: Secondary | ICD-10-CM | POA: Diagnosis not present

## 2022-09-13 DIAGNOSIS — M79642 Pain in left hand: Secondary | ICD-10-CM | POA: Diagnosis not present

## 2022-09-30 DIAGNOSIS — F17200 Nicotine dependence, unspecified, uncomplicated: Secondary | ICD-10-CM | POA: Diagnosis not present

## 2022-09-30 DIAGNOSIS — J0140 Acute pansinusitis, unspecified: Secondary | ICD-10-CM | POA: Diagnosis not present

## 2022-11-03 DIAGNOSIS — Z124 Encounter for screening for malignant neoplasm of cervix: Secondary | ICD-10-CM | POA: Diagnosis not present

## 2022-11-03 DIAGNOSIS — Z1231 Encounter for screening mammogram for malignant neoplasm of breast: Secondary | ICD-10-CM | POA: Diagnosis not present

## 2022-11-03 DIAGNOSIS — Z90711 Acquired absence of uterus with remaining cervical stump: Secondary | ICD-10-CM | POA: Diagnosis not present

## 2022-11-03 DIAGNOSIS — Z01419 Encounter for gynecological examination (general) (routine) without abnormal findings: Secondary | ICD-10-CM | POA: Diagnosis not present

## 2022-11-03 DIAGNOSIS — N952 Postmenopausal atrophic vaginitis: Secondary | ICD-10-CM | POA: Diagnosis not present

## 2022-11-03 DIAGNOSIS — Z6828 Body mass index (BMI) 28.0-28.9, adult: Secondary | ICD-10-CM | POA: Diagnosis not present

## 2022-11-03 DIAGNOSIS — Z01411 Encounter for gynecological examination (general) (routine) with abnormal findings: Secondary | ICD-10-CM | POA: Diagnosis not present

## 2022-11-15 DIAGNOSIS — E1169 Type 2 diabetes mellitus with other specified complication: Secondary | ICD-10-CM | POA: Diagnosis not present

## 2022-12-03 DIAGNOSIS — M545 Low back pain, unspecified: Secondary | ICD-10-CM | POA: Diagnosis not present

## 2023-03-22 ENCOUNTER — Other Ambulatory Visit: Payer: Self-pay | Admitting: Family Medicine

## 2023-03-22 DIAGNOSIS — Z1231 Encounter for screening mammogram for malignant neoplasm of breast: Secondary | ICD-10-CM

## 2023-04-05 DIAGNOSIS — I10 Essential (primary) hypertension: Secondary | ICD-10-CM | POA: Diagnosis not present

## 2023-04-05 DIAGNOSIS — E611 Iron deficiency: Secondary | ICD-10-CM | POA: Diagnosis not present

## 2023-04-05 DIAGNOSIS — E7800 Pure hypercholesterolemia, unspecified: Secondary | ICD-10-CM | POA: Diagnosis not present

## 2023-04-05 DIAGNOSIS — K766 Portal hypertension: Secondary | ICD-10-CM | POA: Diagnosis not present

## 2023-04-05 DIAGNOSIS — D696 Thrombocytopenia, unspecified: Secondary | ICD-10-CM | POA: Diagnosis not present

## 2023-04-05 DIAGNOSIS — I700 Atherosclerosis of aorta: Secondary | ICD-10-CM | POA: Diagnosis not present

## 2023-04-05 DIAGNOSIS — F331 Major depressive disorder, recurrent, moderate: Secondary | ICD-10-CM | POA: Diagnosis not present

## 2023-04-05 DIAGNOSIS — E1165 Type 2 diabetes mellitus with hyperglycemia: Secondary | ICD-10-CM | POA: Diagnosis not present

## 2023-04-05 DIAGNOSIS — F411 Generalized anxiety disorder: Secondary | ICD-10-CM | POA: Diagnosis not present

## 2023-04-05 DIAGNOSIS — K7460 Unspecified cirrhosis of liver: Secondary | ICD-10-CM | POA: Diagnosis not present

## 2023-05-02 DIAGNOSIS — M25512 Pain in left shoulder: Secondary | ICD-10-CM | POA: Diagnosis not present

## 2023-06-30 DIAGNOSIS — E611 Iron deficiency: Secondary | ICD-10-CM | POA: Diagnosis not present

## 2023-08-11 DIAGNOSIS — H2513 Age-related nuclear cataract, bilateral: Secondary | ICD-10-CM | POA: Diagnosis not present

## 2023-08-11 DIAGNOSIS — H52203 Unspecified astigmatism, bilateral: Secondary | ICD-10-CM | POA: Diagnosis not present

## 2023-08-11 DIAGNOSIS — E119 Type 2 diabetes mellitus without complications: Secondary | ICD-10-CM | POA: Diagnosis not present

## 2023-09-30 DIAGNOSIS — E611 Iron deficiency: Secondary | ICD-10-CM | POA: Diagnosis not present

## 2023-09-30 DIAGNOSIS — E1169 Type 2 diabetes mellitus with other specified complication: Secondary | ICD-10-CM | POA: Diagnosis not present

## 2023-09-30 DIAGNOSIS — D696 Thrombocytopenia, unspecified: Secondary | ICD-10-CM | POA: Diagnosis not present

## 2023-09-30 DIAGNOSIS — Z720 Tobacco use: Secondary | ICD-10-CM | POA: Diagnosis not present

## 2023-09-30 DIAGNOSIS — Z0001 Encounter for general adult medical examination with abnormal findings: Secondary | ICD-10-CM | POA: Diagnosis not present

## 2023-09-30 DIAGNOSIS — Z23 Encounter for immunization: Secondary | ICD-10-CM | POA: Diagnosis not present

## 2023-09-30 DIAGNOSIS — F17210 Nicotine dependence, cigarettes, uncomplicated: Secondary | ICD-10-CM | POA: Diagnosis not present

## 2023-09-30 DIAGNOSIS — I10 Essential (primary) hypertension: Secondary | ICD-10-CM | POA: Diagnosis not present

## 2023-09-30 DIAGNOSIS — E7800 Pure hypercholesterolemia, unspecified: Secondary | ICD-10-CM | POA: Diagnosis not present

## 2023-09-30 DIAGNOSIS — E1165 Type 2 diabetes mellitus with hyperglycemia: Secondary | ICD-10-CM | POA: Diagnosis not present

## 2023-09-30 DIAGNOSIS — F331 Major depressive disorder, recurrent, moderate: Secondary | ICD-10-CM | POA: Diagnosis not present

## 2023-11-07 ENCOUNTER — Other Ambulatory Visit: Payer: Self-pay | Admitting: Gastroenterology

## 2023-11-07 DIAGNOSIS — K7460 Unspecified cirrhosis of liver: Secondary | ICD-10-CM | POA: Diagnosis not present

## 2023-11-07 DIAGNOSIS — K219 Gastro-esophageal reflux disease without esophagitis: Secondary | ICD-10-CM | POA: Diagnosis not present

## 2023-11-08 DIAGNOSIS — Z1331 Encounter for screening for depression: Secondary | ICD-10-CM | POA: Diagnosis not present

## 2023-11-08 DIAGNOSIS — Z1231 Encounter for screening mammogram for malignant neoplasm of breast: Secondary | ICD-10-CM | POA: Diagnosis not present

## 2023-11-08 DIAGNOSIS — Z124 Encounter for screening for malignant neoplasm of cervix: Secondary | ICD-10-CM | POA: Diagnosis not present

## 2023-11-08 DIAGNOSIS — Z01419 Encounter for gynecological examination (general) (routine) without abnormal findings: Secondary | ICD-10-CM | POA: Diagnosis not present

## 2023-11-08 DIAGNOSIS — Z01411 Encounter for gynecological examination (general) (routine) with abnormal findings: Secondary | ICD-10-CM | POA: Diagnosis not present

## 2023-11-24 DIAGNOSIS — F325 Major depressive disorder, single episode, in full remission: Secondary | ICD-10-CM | POA: Diagnosis not present

## 2023-11-24 DIAGNOSIS — E119 Type 2 diabetes mellitus without complications: Secondary | ICD-10-CM | POA: Diagnosis not present

## 2023-11-24 DIAGNOSIS — E785 Hyperlipidemia, unspecified: Secondary | ICD-10-CM | POA: Diagnosis not present

## 2023-11-24 DIAGNOSIS — Z833 Family history of diabetes mellitus: Secondary | ICD-10-CM | POA: Diagnosis not present

## 2023-11-24 DIAGNOSIS — F411 Generalized anxiety disorder: Secondary | ICD-10-CM | POA: Diagnosis not present

## 2023-11-24 DIAGNOSIS — K219 Gastro-esophageal reflux disease without esophagitis: Secondary | ICD-10-CM | POA: Diagnosis not present

## 2023-11-24 DIAGNOSIS — Z8249 Family history of ischemic heart disease and other diseases of the circulatory system: Secondary | ICD-10-CM | POA: Diagnosis not present

## 2023-11-24 DIAGNOSIS — M545 Low back pain, unspecified: Secondary | ICD-10-CM | POA: Diagnosis not present

## 2023-11-24 DIAGNOSIS — K745 Biliary cirrhosis, unspecified: Secondary | ICD-10-CM | POA: Diagnosis not present

## 2023-12-01 ENCOUNTER — Ambulatory Visit
Admission: RE | Admit: 2023-12-01 | Discharge: 2023-12-01 | Disposition: A | Discharge status: Home / Self Care | Source: Ambulatory Visit | Attending: Gastroenterology | Admitting: Gastroenterology

## 2023-12-01 DIAGNOSIS — K7460 Unspecified cirrhosis of liver: Secondary | ICD-10-CM

## 2023-12-01 MED ORDER — IOPAMIDOL (ISOVUE-300) INJECTION 61%
500.0000 mL | Freq: Once | INTRAVENOUS | Status: AC | PRN
  Administered 2023-12-01: 80 mL via INTRAVENOUS

## 2023-12-12 DIAGNOSIS — L439 Lichen planus, unspecified: Secondary | ICD-10-CM | POA: Diagnosis not present

## 2024-01-03 DIAGNOSIS — M545 Low back pain, unspecified: Secondary | ICD-10-CM | POA: Diagnosis not present

## 2024-01-03 DIAGNOSIS — M25552 Pain in left hip: Secondary | ICD-10-CM | POA: Diagnosis not present

## 2024-04-17 DIAGNOSIS — K7460 Unspecified cirrhosis of liver: Secondary | ICD-10-CM | POA: Diagnosis not present

## 2024-04-17 DIAGNOSIS — K219 Gastro-esophageal reflux disease without esophagitis: Secondary | ICD-10-CM | POA: Diagnosis not present

## 2024-04-17 DIAGNOSIS — K743 Primary biliary cirrhosis: Secondary | ICD-10-CM | POA: Diagnosis not present

## 2024-04-27 DIAGNOSIS — F331 Major depressive disorder, recurrent, moderate: Secondary | ICD-10-CM | POA: Diagnosis not present

## 2024-04-27 DIAGNOSIS — I10 Essential (primary) hypertension: Secondary | ICD-10-CM | POA: Diagnosis not present

## 2024-04-27 DIAGNOSIS — E1165 Type 2 diabetes mellitus with hyperglycemia: Secondary | ICD-10-CM | POA: Diagnosis not present

## 2024-04-27 DIAGNOSIS — E7800 Pure hypercholesterolemia, unspecified: Secondary | ICD-10-CM | POA: Diagnosis not present

## 2024-04-27 DIAGNOSIS — K7460 Unspecified cirrhosis of liver: Secondary | ICD-10-CM | POA: Diagnosis not present

## 2024-04-27 DIAGNOSIS — E611 Iron deficiency: Secondary | ICD-10-CM | POA: Diagnosis not present

## 2024-04-27 DIAGNOSIS — Z23 Encounter for immunization: Secondary | ICD-10-CM | POA: Diagnosis not present

## 2024-04-27 DIAGNOSIS — F17210 Nicotine dependence, cigarettes, uncomplicated: Secondary | ICD-10-CM | POA: Diagnosis not present

## 2024-04-27 DIAGNOSIS — K743 Primary biliary cirrhosis: Secondary | ICD-10-CM | POA: Diagnosis not present

## 2024-06-12 ENCOUNTER — Other Ambulatory Visit: Payer: Self-pay | Admitting: Gastroenterology

## 2024-06-12 DIAGNOSIS — K7460 Unspecified cirrhosis of liver: Secondary | ICD-10-CM

## 2024-06-21 ENCOUNTER — Ambulatory Visit
Admission: RE | Admit: 2024-06-21 | Discharge: 2024-06-21 | Disposition: A | Source: Ambulatory Visit | Attending: Gastroenterology

## 2024-06-21 DIAGNOSIS — K7460 Unspecified cirrhosis of liver: Secondary | ICD-10-CM | POA: Diagnosis not present

## 2024-06-27 DIAGNOSIS — D696 Thrombocytopenia, unspecified: Secondary | ICD-10-CM | POA: Diagnosis not present

## 2024-07-19 DIAGNOSIS — M5126 Other intervertebral disc displacement, lumbar region: Secondary | ICD-10-CM | POA: Diagnosis not present

## 2024-07-19 DIAGNOSIS — M5442 Lumbago with sciatica, left side: Secondary | ICD-10-CM | POA: Diagnosis not present
